# Patient Record
Sex: Female | Born: 1960 | Race: White | Hispanic: No | Smoking: Never smoker
Health system: Southern US, Community
[De-identification: ages and names within clinical notes are randomized; demographics above are authoritative.]

## PROBLEM LIST (undated history)

## (undated) DIAGNOSIS — F419 Anxiety disorder, unspecified: Secondary | ICD-10-CM

## (undated) DIAGNOSIS — D249 Benign neoplasm of unspecified breast: Secondary | ICD-10-CM

## (undated) DIAGNOSIS — F319 Bipolar disorder, unspecified: Secondary | ICD-10-CM

## (undated) HISTORY — PX: WISDOM TOOTH EXTRACTION: SHX21

## (undated) HISTORY — PX: ABDOMINAL HYSTERECTOMY: SHX81

---

## 1999-02-05 ENCOUNTER — Other Ambulatory Visit: Admission: RE | Admit: 1999-02-05 | Discharge: 1999-02-05 | Payer: Self-pay | Admitting: Obstetrics & Gynecology

## 1999-03-07 ENCOUNTER — Encounter: Payer: Self-pay | Admitting: Specialist

## 1999-03-07 ENCOUNTER — Ambulatory Visit (HOSPITAL_COMMUNITY): Admission: RE | Admit: 1999-03-07 | Discharge: 1999-03-07 | Payer: Self-pay | Admitting: Specialist

## 1999-04-21 ENCOUNTER — Encounter: Payer: Self-pay | Admitting: Specialist

## 1999-04-21 ENCOUNTER — Ambulatory Visit (HOSPITAL_COMMUNITY): Admission: RE | Admit: 1999-04-21 | Discharge: 1999-04-21 | Payer: Self-pay | Admitting: Specialist

## 2000-05-04 ENCOUNTER — Other Ambulatory Visit: Admission: RE | Admit: 2000-05-04 | Discharge: 2000-05-04 | Payer: Self-pay | Admitting: Obstetrics & Gynecology

## 2000-06-08 ENCOUNTER — Encounter: Admission: RE | Admit: 2000-06-08 | Discharge: 2000-09-04 | Payer: Self-pay | Admitting: Anesthesiology

## 2001-08-26 ENCOUNTER — Other Ambulatory Visit: Admission: RE | Admit: 2001-08-26 | Discharge: 2001-08-26 | Payer: Self-pay | Admitting: Obstetrics & Gynecology

## 2002-11-07 ENCOUNTER — Other Ambulatory Visit: Admission: RE | Admit: 2002-11-07 | Discharge: 2002-11-07 | Payer: Self-pay | Admitting: Obstetrics & Gynecology

## 2003-09-07 ENCOUNTER — Ambulatory Visit (HOSPITAL_COMMUNITY): Admission: RE | Admit: 2003-09-07 | Discharge: 2003-09-07 | Payer: Self-pay | Admitting: Obstetrics & Gynecology

## 2003-12-14 ENCOUNTER — Other Ambulatory Visit: Admission: RE | Admit: 2003-12-14 | Discharge: 2003-12-14 | Payer: Self-pay | Admitting: Obstetrics & Gynecology

## 2007-04-22 ENCOUNTER — Emergency Department (HOSPITAL_COMMUNITY): Admission: EM | Admit: 2007-04-22 | Discharge: 2007-04-23 | Payer: Self-pay | Admitting: Emergency Medicine

## 2009-06-25 ENCOUNTER — Encounter: Admission: RE | Admit: 2009-06-25 | Discharge: 2009-06-25 | Payer: Self-pay | Admitting: Obstetrics & Gynecology

## 2010-01-26 ENCOUNTER — Encounter: Payer: Self-pay | Admitting: Emergency Medicine

## 2010-03-15 ENCOUNTER — Inpatient Hospital Stay (HOSPITAL_COMMUNITY): Payer: 59

## 2010-03-15 ENCOUNTER — Inpatient Hospital Stay (HOSPITAL_COMMUNITY)
Admission: AD | Admit: 2010-03-15 | Discharge: 2010-03-15 | Disposition: A | Discharge status: Home / Self Care | Payer: 59 | Source: Ambulatory Visit | Attending: Obstetrics and Gynecology | Admitting: Obstetrics and Gynecology

## 2010-03-15 DIAGNOSIS — N949 Unspecified condition associated with female genital organs and menstrual cycle: Secondary | ICD-10-CM

## 2010-03-15 DIAGNOSIS — N938 Other specified abnormal uterine and vaginal bleeding: Secondary | ICD-10-CM

## 2010-03-15 DIAGNOSIS — D259 Leiomyoma of uterus, unspecified: Secondary | ICD-10-CM | POA: Insufficient documentation

## 2010-03-15 LAB — CBC
HCT: 36.3 % (ref 36.0–46.0)
Hemoglobin: 11.9 g/dL — ABNORMAL LOW (ref 12.0–15.0)
MCH: 29.8 pg (ref 26.0–34.0)
MCHC: 32.8 g/dL (ref 30.0–36.0)
MCV: 90.8 fL (ref 78.0–100.0)
Platelets: 304 10*3/uL (ref 150–400)
RBC: 4 MIL/uL (ref 3.87–5.11)
RDW: 13 % (ref 11.5–15.5)
WBC: 8.7 10*3/uL (ref 4.0–10.5)

## 2010-03-15 LAB — WET PREP, GENITAL
Clue Cells Wet Prep HPF POC: NONE SEEN
Trich, Wet Prep: NONE SEEN
Yeast Wet Prep HPF POC: NONE SEEN

## 2010-05-23 NOTE — Procedures (Signed)
[facility]  Patient:    Kelly Nelson, Kelly Nelson                        MRN: [id number] Proc. Date: 06/09/00 Adm. Date:  [id number] Attending:  Thyra Breed CC:         Javier Docker, M.D.   Procedure Report  PROCEDURE:  Bilateral facet joint injections at L5-S1 and L4-5.  DIAGNOSIS:  Lumbar spondylosis.  HISTORY OF PRESENT ILLNESS:  Kelly Nelson is a 50 year old who was sent to [place] by Dr. Jene Every for facet joint injections and consideration for pain management. The patient states that she was in her usual state of health up until about six years ago when she had the sudden onset of lower back discomfort. Prior to that, shed had some intermittent problems. She was initially treated conservatively with nonsteroidal anti-inflammatory agents then Vicodin which resulted in migraine headaches and eventually went through lumbar epidural steroid injections about five years ago and about April of 2001 began to get facet joint injections. She noted that these would help tremendously. Her first set was done by Dr. Barrington Ellison and her second set done by Dr. Ethelene Hal. She was doing remarkably well after her last set in August of 2001 when she fell down some steps in February of 2002 which stirred up her symptoms. Its the same symptoms that she has had previously. The discomfort did not occur immediately after she fell down the steps but began about two to three weeks later. She notes that the pain would affect her to the point that she had difficulty straightening up and she was placed on Percocet which she states sedates her and upsets her stomach and may take some of the pain down but is not a very good pain reliever. She can only take about 1-3 per day. Dr. Shelle Iron evaluate her and placed her on Percocet at this point and when she did not improve, placed her in integrative therapies which she has noted has been very helpful. She is starting into biofeedback with them. Prior to  having the injections, she had been through chiropractic treatments which had not been helpful and had a trial of a TENS then which was not helpful.  She describes her pain as a hot burning pain in the lumbosacral region which is exacerbated to a knife-like sensation whenever she moves. She notes that she has intermittent numbness and tingling of the arms and legs and global but not focal weakness. She denied bowel or bladder incontinence. It is made worse by prolonged movements or stationary positions and improved by moderate movement and activities. She is currently on Mobic which she states is not helpful at all, Robaxin which she rarely takes, trazodone which helps her to rest at night and Percocet which upsets her stomach more then helps her.  CURRENT MEDICATIONS:  Percocet, Mobic, robaxin, trazodone, orthocycline.  ALLERGIES:  No known drug allergies.  FAMILY HISTORY:  Positive for coronary artery disease, cancer, hypertension, hypothyroidism, osteoarthritis.  ACTIVE MEDICAL PROBLEMS:  Hypertension, osteoarthritis predominantly of the axial skeleton.  PAST SURGICAL HISTORY:  Significant for the injections but otherwise negative.  SOCIAL HISTORY:  The patients a nonsmoker, nondrinker. 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  REVIEW OF SYSTEMS:  GENERAL:  Negative. HEAD:  Significant for migraine headaches for which she sees Dr. Meryl Crutch who started her on the Trazodone. EYES:  Negative.  NOSE/MOUTH/THROAT:  Negative. EARS:  Significant for itching otherwise negative. LUNGS: Negative. CARDIOVASCULAR:  Significant for episodic elevations in blood pressure but not sustained hypertension. GI: Negative. GU:  Negative. MUSCULOSKELETAL:  See HPI. NEUROLOGIC:  No history of seizure or stroke. See HPI. HEMATOLOGIC:  History of anemia. ENDOCRINE: Negative. CUTANEOUS:  Negative. PSYCHIATRIC:  Positive for depression. ALLERGY/IMMUNOLOGIC:   Positive for hay fever.  Investigations forwarded with the patient included a cervical MRI from May 05, 2000 which showed degenerative disk disease with disk herniations at multiple levels, thoracic MRI which showed disk herniation at T6-7 and degenerative disk disease at multiple levels and a lumbar MRI which showed multilevel degenerative disk disease and facet joint arthropathy. She apparently has had an EMG performed by Dr. Ethelene Hal which was interpreted as normal of the upper extremities.  PHYSICAL EXAMINATION:  VITAL SIGNS:  Blood pressure 125/78, heart rate 89, respiratory rate 18, O2 saturations 98%, pain level is 8/10 and temperature is 97.7.  GENERAL:  This is a pleasant female in no acute distress.  HEENT:  Head was normocephalic, atraumatic. Eyes, extraocular movements intact with conjunctivae and sclerae clear. Nose patent nares. Oropharynx was free of lesions.  NECK:  Supple without lymphadenopathy. Carotids are 2+ and symmetric without bruits.  LUNGS:  Clear to auscultation and percussion.  HEART:  Regular rate and rhythm.  BREASTS/ABDOMINAL/PELVIC/RECTAL:  Not performed.  BACK:  Revealed increased pain on hyperextension to about 10-15 degrees with forward flexion reducing her discomfort. Straight leg raise signs were negative.  EXTREMITIES:  No cyanosis, clubbing nor edema with radial pulses and dorsalis pedis pulse 2+ and symmetric.  NEUROLOGIC:  The patient was oriented x 4. Cranial nerves II-XII are grossly intact. Deep tendon reflexes were symmetric in the upper and lower extremity with downgoing toes. Motor was 5/5 with symmetric bulk and tone. Coordination was grossly intact.  IMPRESSION: 1. Chronic low back pain syndrome responsive to facet joint injections with    underlying facet joint arthritis and degenerative disk disease in    combination for lumbar spondylosis. 2. Cervical degenerative disk disease. 3. Thoracic degenerative disk disease. 4.  Migraine headaches per Dr. Meryl Crutch.  5. Depression for which she is on Trazodone from Dr. Meryl Crutch. 6. Hay fever. 7. History of anemia.  DISPOSITION:  I discussed with the patient treatment options. She is not really taking enough opiates to merit going on long acting opiates and it did not sound like they are really significantly helpful. She has been helped by injection in the past so I recommend that we go ahead and proceed with facet joint injections today which she is quite open to. I advised her since the Mobic does not appear to be helping her she could probably just go off this.  DESCRIPTION OF PROCEDURE:  After informed consent was obtained, the patient was taken to the fluoroscopy suite where she was placed in the prone position with a pillow under her abdomen and monitored. Her back was prepped with Betaine x 3. I draped out the back with towels. Using fluoroscopic guidance, I optimized visualization of the L4-5 and L5-S1 facet joints on the right side first. The skin was marked and anesthetized with a 25 gauge needle using 2 cc of 1% lidocaine. Twenty-two gauge Chiba needles were introduced into the L4-5 and 5-S1 facet joints confirmed by lateral, oblique and AP projections. Aspiration was negative for blood and CSF. I injected a 0.5 cc of 1% lidocaine at each level with 20 mg of Medrol mixed with 1 cc  of 1% lidocaine. The needles were flushed with 1% lidocaine and removed intact. On the left side, the beam was optimized and skin marked and anesthetized using 25 gauge needles with 1% lidocaine using 2 cc at each site. Twenty-two gauge chiba needles were likewise placed and injected on the left side. The needles were removed intact.  Fifteen minutes later, the patient was noted to have marked reduction in her pain.  DISPOSITION: 1. Resume previous diet. 2. Limitations in activities per instruction sheet as outlined by my assistant    today. 3. Continue on current  medications with the exception of stopping the Mobic. 4. Followup with me in four weeks to assess how well she responds to the    injections. DD:  06/09/00 TD:  06/09/00 Job: [id number]

## 2010-05-23 NOTE — Consult Note (Signed)
[facility]  Patient:    Kelly Nelson, Kelly Nelson                        MRN: [id number] Proc. Date: 07/07/00 Adm. Date:  [id number] Attending:  Thyra Breed CC:         Javier Docker, M.D.   Consultation Report  FOLLOW-UP EVALUATION  Aiesha comes in for follow-up evaluation after her facet joint injections on June 09, 2000.  She has noted minimal improvement after these injections.  As I explained to her, I felt like she was getting to a point where they may not be very helpful, and it sounds like further injection therapy may not be of much benefit unless we were to go in and consider doing facet joint nerve blocks. She has been able to tolerate 1-2 tablets of Percocet per day but feels sedated by these.  She was started on celebrex last week by Dr. Shelle Iron and does feel as though this has resulted in a 20-30% reduction in her pain.  She is complaining of neck and lower back pain.  She cannot sit for long periods of time without a great deal of discomfort.  She has some persistent pain in the fourth and fifth fingers of both hands.  She describes her pain as an ongoing burning-type discomfort in the lumbosacral region with knife-like discomfort that is radiating peripherally.  She rates her pain at 7/10 today.  Current medications are trazodone 150-300 mg per day; Celebrex, and Percocet.  PHYSICAL EXAMINATION:  Blood pressure is 122/81.  Heart rate is 84, respiratory rates 18, O2 saturations 98%.  Pain level is 7/10.  Range of motion of her neck is relatively intact with negative with negative Spurling signs.  Deep tendon reflexes are symmetric in the upper extremities.  The lower extremity demonstrated symmetric deep tendon reflexes with negative straight leg raise signs.  IMPRESSION: 1. Chronic low back pain on the basis of lumbar facet joint arthritis    secondary to lumbar spondylosis. 2. Cervical degenerative disk disease. 3. Migraine headaches per  Dr. Meryl Crutch, apparently exacerbated by hydrocodone. 4. Other medical problems per primary care physician.  DISPOSITION: 1. Methadone 5 mg 1/2 tablet p.o. q.12h. #30.  She is aware of the potential    side effects of this medication in which I reviewed with her in great    detail.  I also advised her that we were starting with an incredibly low    dose to see how she responds to this. 2. Continue with Celebrex per Dr. Shelle Iron. 3. Continue on trazodone per Dr. Meryl Crutch. 4. Follow up with me in four weeks. 5. I wrote her a prescription for her work so that she can get up every hour    to walk around for five minutes and recommended they elevate her keyboard    as long as it does not exacerbate any potential carpal tunnel type    symptoms.  I encouraged her to speak with her physical therapist with    regard to any ergonomics recommendations and have these written down so    that I could write these as they needed to be from a physician.  She was    encouraged to continue with her physical therapy. DD:  07/07/00 TD:  07/07/00 Job: [id number]

## 2010-09-30 LAB — POCT CARDIAC MARKERS
CKMB, poc: 1.8
Operator id: 4533
Troponin i, poc: <0.05

## 2010-09-30 LAB — BASIC METABOLIC PANEL
BUN: 9
Calcium: 9.4
GFR calc Af Amer: >60
GFR calc non Af Amer: >60
Sodium: 143

## 2010-09-30 LAB — DIFFERENTIAL
Eosinophils Absolute: 0.6
Lymphocytes Relative: 35
Lymphs Abs: 3.9
Monocytes Absolute: 0.7
Monocytes Relative: 6
Neutrophils Relative %: 54

## 2010-09-30 LAB — CBC
Hemoglobin: 13.6
MCHC: 33.4
MCV: 91.4
RDW: 12.8

## 2016-01-08 DIAGNOSIS — F3181 Bipolar II disorder: Secondary | ICD-10-CM | POA: Diagnosis not present

## 2016-01-08 DIAGNOSIS — F419 Anxiety disorder, unspecified: Secondary | ICD-10-CM | POA: Diagnosis not present

## 2016-01-09 DIAGNOSIS — N951 Menopausal and female climacteric states: Secondary | ICD-10-CM | POA: Diagnosis not present

## 2016-01-09 DIAGNOSIS — R3915 Urgency of urination: Secondary | ICD-10-CM | POA: Diagnosis not present

## 2016-01-09 DIAGNOSIS — N3281 Overactive bladder: Secondary | ICD-10-CM | POA: Diagnosis not present

## 2016-04-08 ENCOUNTER — Other Ambulatory Visit: Payer: Self-pay | Admitting: Family Medicine

## 2016-04-08 DIAGNOSIS — Z1231 Encounter for screening mammogram for malignant neoplasm of breast: Secondary | ICD-10-CM

## 2016-04-27 ENCOUNTER — Ambulatory Visit
Admission: RE | Admit: 2016-04-27 | Discharge: 2016-04-27 | Disposition: A | Discharge status: Home / Self Care | Payer: BLUE CROSS/BLUE SHIELD | Source: Ambulatory Visit | Attending: Family Medicine | Admitting: Family Medicine

## 2016-04-27 DIAGNOSIS — Z1231 Encounter for screening mammogram for malignant neoplasm of breast: Secondary | ICD-10-CM

## 2016-04-29 ENCOUNTER — Other Ambulatory Visit: Payer: Self-pay | Admitting: Family Medicine

## 2016-04-29 DIAGNOSIS — R928 Other abnormal and inconclusive findings on diagnostic imaging of breast: Secondary | ICD-10-CM

## 2016-05-04 ENCOUNTER — Ambulatory Visit
Admission: RE | Admit: 2016-05-04 | Discharge: 2016-05-04 | Disposition: A | Discharge status: Home / Self Care | Payer: BLUE CROSS/BLUE SHIELD | Source: Ambulatory Visit | Attending: Family Medicine | Admitting: Family Medicine

## 2016-05-04 ENCOUNTER — Other Ambulatory Visit: Payer: Self-pay | Admitting: Family Medicine

## 2016-05-04 DIAGNOSIS — R928 Other abnormal and inconclusive findings on diagnostic imaging of breast: Secondary | ICD-10-CM

## 2016-05-04 DIAGNOSIS — N6489 Other specified disorders of breast: Secondary | ICD-10-CM | POA: Diagnosis not present

## 2016-05-21 DIAGNOSIS — Z86010 Personal history of colonic polyps: Secondary | ICD-10-CM | POA: Diagnosis not present

## 2016-05-21 DIAGNOSIS — Z1211 Encounter for screening for malignant neoplasm of colon: Secondary | ICD-10-CM | POA: Diagnosis not present

## 2016-07-07 DIAGNOSIS — R350 Frequency of micturition: Secondary | ICD-10-CM | POA: Diagnosis not present

## 2016-08-10 DIAGNOSIS — E559 Vitamin D deficiency, unspecified: Secondary | ICD-10-CM | POA: Diagnosis not present

## 2016-08-10 DIAGNOSIS — Z1329 Encounter for screening for other suspected endocrine disorder: Secondary | ICD-10-CM | POA: Diagnosis not present

## 2016-08-10 DIAGNOSIS — Z131 Encounter for screening for diabetes mellitus: Secondary | ICD-10-CM | POA: Diagnosis not present

## 2016-08-10 DIAGNOSIS — Z136 Encounter for screening for cardiovascular disorders: Secondary | ICD-10-CM | POA: Diagnosis not present

## 2016-08-10 DIAGNOSIS — Z0000 Encounter for general adult medical examination without abnormal findings: Secondary | ICD-10-CM | POA: Diagnosis not present

## 2016-10-05 ENCOUNTER — Other Ambulatory Visit: Payer: BLUE CROSS/BLUE SHIELD

## 2016-10-14 ENCOUNTER — Ambulatory Visit
Admission: RE | Admit: 2016-10-14 | Discharge: 2016-10-14 | Disposition: A | Discharge status: Home / Self Care | Payer: BLUE CROSS/BLUE SHIELD | Source: Ambulatory Visit | Attending: Family Medicine | Admitting: Family Medicine

## 2016-10-14 ENCOUNTER — Other Ambulatory Visit: Payer: Self-pay | Admitting: Family Medicine

## 2016-10-14 DIAGNOSIS — N6312 Unspecified lump in the right breast, upper inner quadrant: Secondary | ICD-10-CM | POA: Diagnosis not present

## 2016-10-14 DIAGNOSIS — N6310 Unspecified lump in the right breast, unspecified quadrant: Secondary | ICD-10-CM

## 2016-10-14 DIAGNOSIS — R928 Other abnormal and inconclusive findings on diagnostic imaging of breast: Secondary | ICD-10-CM

## 2016-10-16 DIAGNOSIS — F3181 Bipolar II disorder: Secondary | ICD-10-CM | POA: Diagnosis not present

## 2016-10-19 ENCOUNTER — Ambulatory Visit
Admission: RE | Admit: 2016-10-19 | Discharge: 2016-10-19 | Disposition: A | Discharge status: Home / Self Care | Payer: BLUE CROSS/BLUE SHIELD | Source: Ambulatory Visit | Attending: Family Medicine | Admitting: Family Medicine

## 2016-10-19 ENCOUNTER — Other Ambulatory Visit: Payer: Self-pay | Admitting: Family Medicine

## 2016-10-19 DIAGNOSIS — N6341 Unspecified lump in right breast, subareolar: Secondary | ICD-10-CM | POA: Diagnosis not present

## 2016-10-19 DIAGNOSIS — N6041 Mammary duct ectasia of right breast: Secondary | ICD-10-CM | POA: Diagnosis not present

## 2016-10-19 DIAGNOSIS — N6310 Unspecified lump in the right breast, unspecified quadrant: Secondary | ICD-10-CM

## 2016-10-20 ENCOUNTER — Other Ambulatory Visit: Payer: Self-pay | Admitting: Family Medicine

## 2016-10-20 DIAGNOSIS — N6310 Unspecified lump in the right breast, unspecified quadrant: Secondary | ICD-10-CM

## 2016-10-20 DIAGNOSIS — R102 Pelvic and perineal pain: Secondary | ICD-10-CM | POA: Diagnosis not present

## 2016-10-21 ENCOUNTER — Other Ambulatory Visit: Payer: Self-pay | Admitting: Family Medicine

## 2016-10-21 ENCOUNTER — Ambulatory Visit
Admission: RE | Admit: 2016-10-21 | Discharge: 2016-10-21 | Disposition: A | Discharge status: Home / Self Care | Payer: BLUE CROSS/BLUE SHIELD | Source: Ambulatory Visit | Attending: Family Medicine | Admitting: Family Medicine

## 2016-10-21 DIAGNOSIS — N6312 Unspecified lump in the right breast, upper inner quadrant: Secondary | ICD-10-CM | POA: Diagnosis not present

## 2016-10-21 DIAGNOSIS — N6310 Unspecified lump in the right breast, unspecified quadrant: Secondary | ICD-10-CM

## 2016-11-02 ENCOUNTER — Other Ambulatory Visit: Payer: Self-pay | Admitting: Family Medicine

## 2016-11-02 ENCOUNTER — Ambulatory Visit
Admission: RE | Admit: 2016-11-02 | Discharge: 2016-11-02 | Disposition: A | Discharge status: Home / Self Care | Payer: BLUE CROSS/BLUE SHIELD | Source: Ambulatory Visit | Attending: Family Medicine | Admitting: Family Medicine

## 2016-11-02 DIAGNOSIS — N6310 Unspecified lump in the right breast, unspecified quadrant: Secondary | ICD-10-CM

## 2016-11-02 DIAGNOSIS — N6312 Unspecified lump in the right breast, upper inner quadrant: Secondary | ICD-10-CM | POA: Diagnosis not present

## 2016-11-12 DIAGNOSIS — Z23 Encounter for immunization: Secondary | ICD-10-CM | POA: Diagnosis not present

## 2017-03-05 ENCOUNTER — Other Ambulatory Visit: Payer: Self-pay | Admitting: Surgery

## 2017-03-05 ENCOUNTER — Ambulatory Visit: Payer: Self-pay | Admitting: Surgery

## 2017-03-05 DIAGNOSIS — D241 Benign neoplasm of right breast: Secondary | ICD-10-CM

## 2017-03-05 NOTE — H&P (View-Only) (Signed)
Kelly Nelson Documented: 03/05/2017 11:32 AM Location: [facility] Patient #: [id number] DOB: [birth year] Single / Language: Kelly Nelson / Race: White Female  History of Present Illness Kelly Nelson A. Kelly Kelm MD; 03/05/2017 11:52 AM) Patient words: The patient was called back from screening mammography on April 27, 2016 for a right breast mass. The diagnostic mammogram and ultrasound May 04, 2016 demonstrated two sonographically identified masses. Both were followed in October 2018. On October 19, 2016, the 1 o'clock subareolar mass was biopsied with instructions to biopsy the second mass at 1 o'clock, 1 cm from the nipple if surgery was required due to the biopsied mass. The patient returned 2 weeks ago but the mass at 1 o'clock, 1 cm from the nipple was not visualized due to a post biopsy hematoma. She returns today.  EXAM: ULTRASOUND OF THE RIGHT BREAST  COMPARISON: Previous exam(s).  FINDINGS: On physical exam, no suspicious lumps are identified. The hematoma has nearly resolved clinically.  Targeted ultrasound is performed, showing the biopsied mass at 1 o'clock in the subareolar region. The previously identified isoechoic mass, described as a fibroadenoma versus a fat lobule at 1 o'clock, 1 cm from the nipple was not visualized today  IMPRESSION: The mass at 1 o'clock, 1 cm from the nipple cannot be visualized. There are 3 possibilities for nonvisualization. First, the mass may have represented a fat lobule which is simply no longer as conspicuous due to changes from recent adjacent biopsy. This is considered most likely. Second, the mass could have represented a complicated cyst which was disrupted at the time of biopsy. This option is considered unlikely. Finally, the mass could have represented an isoechoic fibroadenoma although this is also considered less likely.  RECOMMENDATION: If clinically warranted, an MRI could further evaluate the breast to exclude an  underlying suspicious mass. However, I suspect the mass was most likely a fat lobule which is no longer seen due to post biopsy changes rather than a true mass.  I have discussed the findings and recommendations with the patient. Results were also provided in writing at the conclusion of the visit. If applicable, a reminder letter will be sent to the patient regarding the next appointment.  BI-RADS CATEGORY 2: Benign.   Electronically Signed By: Kelly Nelson M.D            Diagnosis Breast, right, needle core biopsy, 1:00 o'clock retroareolar - FEATURES CONSISTENT WITH A PAPILLARY LESION - SEE COMMENT Microscopic Comment The biopsy has cystically dilated glands with small foci suggestive of fibrovascular cores. There are no atypical features noted in the ductal epithelium but the biopsy material is limited. These results were called to [facility] on October 20, 2016. Kelly Sheller MD Pathologist, Electronic Signature (Case signed 10/20/2016).  The patient is a 57 year old female.   Past Surgical History (Kelly Nelson, [place]; 03/05/2017 11:32 AM) Breast Biopsy Right. Colon Polyp Removal - Colonoscopy Hysterectomy (not due to cancer) - Complete Oral Surgery  Diagnostic Studies History (Kelly Nelson, [place]; 03/05/2017 11:32 AM) Colonoscopy within last year Mammogram within last year Pap Smear >5 years ago  Allergies (Kelly Nelson, [place]; 03/05/2017 11:34 AM) Sulfa Antibiotics Nausea, Vomiting. Allergies Reconciled  Medication History (Kelly Nelson, RMA; 03/05/2017 11:35 AM) TraZODone HCl (100MG  Tablet, Oral) Active. Estradiol (2MG  Tablet, Oral) Active. ALPRAZolam (1MG  Tablet, Oral) Active. OXcarbazepine (150MG  Tablet, Oral) Active. Ziprasidone HCl (20MG  Capsule, Oral) Active. Medications Reconciled  Social History (Kelly Nelson, [place]; 03/05/2017 11:32 AM)  Alcohol use Moderate alcohol use. Caffeine use  Coffee, Tea. Illicit drug use Uses socially only. Tobacco use Never smoker.  Family History (Kelly Nelson, [place]; 03/05/2017 11:32 AM) Arthritis Father, Mother. Bleeding disorder Daughter. Depression Daughter. Heart Disease Father, Mother. Heart disease in female family member before age 51 Hypertension Father. Migraine Headache Daughter. Ovarian Cancer Mother. Seizure disorder Daughter. Thyroid problems Brother, Mother.  Pregnancy / Birth History (Kelly Nelson, [place]; 03/05/2017 11:32 AM) Age at menarche 38 years. Age of menopause 5-55 Gravida 1 Irregular periods Length (months) of breastfeeding 3-6 Maternal age 64-35 Para 1  Other Problems (Kelly Nelson, [place]; 03/05/2017 11:32 AM) Anxiety Disorder Arthritis Asthma Back Pain Bladder Problems Depression High blood pressure Lump In Breast Migraine Headache     Review of Systems (Kelly A. Brown RMA; 03/05/2017 11:32 AM) General Present- Fatigue and Weight Gain. Not Present- Appetite Loss, Chills, Fever, Night Sweats and Weight Loss. Skin Present- Dryness. Not Present- Change in Wart/Mole, Hives, Jaundice, New Lesions, Non-Healing Wounds, Rash and Ulcer. HEENT Not Present- Earache, Hearing Loss, Hoarseness, Nose Bleed, Oral Ulcers, Ringing in the Ears, Seasonal Allergies, Sinus Pain, Sore Throat, Visual Disturbances, Wears glasses/contact lenses and Yellow Eyes. Respiratory Present- Snoring. Not Present- Bloody sputum, Chronic Cough, Difficulty Breathing and Wheezing. Breast Present- Breast Mass. Not Present- Breast Pain, Nipple Discharge and Skin Changes. Cardiovascular Not Present- Chest Pain, Difficulty Breathing Lying Down, Leg Cramps, Palpitations, Rapid Heart Rate, Shortness of Breath and Swelling of Extremities. Gastrointestinal Not Present- Abdominal Pain, Bloating, Bloody Stool, Change in Bowel Habits, Chronic diarrhea, Constipation, Difficulty Swallowing, Excessive gas, Gets full  quickly at meals, Hemorrhoids, Indigestion, Nausea, Rectal Pain and Vomiting. Female Genitourinary Not Present- Frequency, Nocturia, Painful Urination, Pelvic Pain and Urgency. Musculoskeletal Present- Back Pain and Joint Stiffness. Not Present- Joint Pain, Muscle Pain, Muscle Weakness and Swelling of Extremities. Neurological Present- Headaches. Not Present- Decreased Memory, Fainting, Numbness, Seizures, Tingling, Tremor, Trouble walking and Weakness. Psychiatric Present- Anxiety, Bipolar and Depression. Not Present- Change in Sleep Pattern, Fearful and Frequent crying. Endocrine Not Present- Cold Intolerance, Excessive Hunger, Hair Changes, Heat Intolerance, Hot flashes and New Diabetes. Hematology Not Present- Blood Thinners, Easy Bruising, Excessive bleeding, Gland problems, HIV and Persistent Infections.  Vitals (Kelly A. Brown RMA; 03/05/2017 11:33 AM) 03/05/2017 11:32 AM Weight: 146.8 lb Height: 62in Body Surface Area: 1.68 m Body Mass Index: 26.85 kg/m  Temp.: 98.73F  Pulse: 82 (Regular)  BP: 128/86 (Sitting, Left Arm, Standard)      Physical Exam ([place] Shannahan A. Domingo Fuson MD; 03/05/2017 11:55 AM)  General Mental Status-Alert. General Appearance-Consistent with stated age. Hydration-Well hydrated. Voice-Normal.  Head and Neck Head-normocephalic, atraumatic with no lesions or palpable masses. Trachea-midline. Thyroid Gland Characteristics - normal size and consistency.  Breast Breast - Left-Symmetric, Non Tender, No Biopsy scars, no Dimpling, No Inflammation, No Lumpectomy scars, No Mastectomy scars, No Peau d' Orange. Breast - Right-Symmetric, Non Tender, No Biopsy scars, no Dimpling, No Inflammation, No Lumpectomy scars, No Mastectomy scars, No Peau d' Orange. Breast Lump-No Palpable Breast Mass.  Cardiovascular Cardiovascular examination reveals -normal heart sounds, regular rate and rhythm with no murmurs and normal pedal pulses  bilaterally.  Neurologic Neurologic evaluation reveals -alert and oriented x 3 with no impairment of recent or remote memory. Mental Status-Normal.  Musculoskeletal Normal Exam - Left-Upper Extremity Strength Normal and Lower Extremity Strength Normal. Normal Exam - Right-Upper Extremity Strength Normal and Lower Extremity Strength Normal.  Lymphatic Head & Neck  General Head & Neck Lymphatics: Bilateral - Description -  Normal. Axillary  General Axillary Region: Bilateral - Description - Normal. Tenderness - Non Tender.    Assessment & Plan ([place] Nida A. Mi Balla MD; 03/05/2017 11:54 AM)  PAPILLOMA OF RIGHT BREAST (D24.1) Impression: right breast seed lumpectomy Risk of lumpectomy include bleeding, infection, seroma, more surgery, use of seed/wire, wound care, cosmetic deformity and the need for other treatments, death , blood clots, death. Pt agrees to proceed.  Risk of malignancy is 3 - 4 %  Observation also discussed she desires lumpectomy  Current Plans You are being scheduled for surgery- Our schedulers will call you.  You should hear from our office's scheduling department within 5 working days about the location, date, and time of surgery. We try to make accommodations for patient's preferences in scheduling surgery, but sometimes the OR schedule or the surgeon's schedule prevents [place] from making those accommodations.  If you have not heard from our office [phone]) in 5 working days, call the office and ask for your surgeon's nurse.  If you have other questions about your diagnosis, plan, or surgery, call the office and ask for your surgeon's nurse.  Pt Education - [facility] Breast Biopsy HCI: discussed with patient and provided information.

## 2017-03-05 NOTE — H&P (Signed)
Kelly Nelson Documented: 03/05/2017 11:32 AM Location: [facility] Patient #: [id number] DOB: [birth year] Single / Language: Kelly Nelson / Race: White Female  History of Present Illness Kelly Moores A. Negan Grudzien MD; 03/05/2017 11:52 AM) Patient words: The patient was called back from screening mammography on April 27, 2016 for a right breast mass. The diagnostic mammogram and ultrasound May 04, 2016 demonstrated two sonographically identified masses. Both were followed in October 2018. On October 19, 2016, the 1 o'clock subareolar mass was biopsied with instructions to biopsy the second mass at 1 o'clock, 1 cm from the nipple if surgery was required due to the biopsied mass. The patient returned 2 weeks ago but the mass at 1 o'clock, 1 cm from the nipple was not visualized due to a post biopsy hematoma. She returns today.  EXAM: ULTRASOUND OF THE RIGHT BREAST  COMPARISON: Previous exam(s).  FINDINGS: On physical exam, no suspicious lumps are identified. The hematoma has nearly resolved clinically.  Targeted ultrasound is performed, showing the biopsied mass at 1 o'clock in the subareolar region. The previously identified isoechoic mass, described as a fibroadenoma versus a fat lobule at 1 o'clock, 1 cm from the nipple was not visualized today  IMPRESSION: The mass at 1 o'clock, 1 cm from the nipple cannot be visualized. There are 3 possibilities for nonvisualization. First, the mass may have represented a fat lobule which is simply no longer as conspicuous due to changes from recent adjacent biopsy. This is considered most likely. Second, the mass could have represented a complicated cyst which was disrupted at the time of biopsy. This option is considered unlikely. Finally, the mass could have represented an isoechoic fibroadenoma although this is also considered less likely.  RECOMMENDATION: If clinically warranted, an MRI could further evaluate the breast to exclude an  underlying suspicious mass. However, I suspect the mass was most likely a fat lobule which is no longer seen due to post biopsy changes rather than a true mass.  I have discussed the findings and recommendations with the patient. Results were also provided in writing at the conclusion of the visit. If applicable, a reminder letter will be sent to the patient regarding the next appointment.  BI-RADS CATEGORY 2: Benign.   Electronically Signed By: Kelly Nelson M.D            Diagnosis Breast, right, needle core biopsy, 1:00 o'clock retroareolar - FEATURES CONSISTENT WITH A PAPILLARY LESION - SEE COMMENT Microscopic Comment The biopsy has cystically dilated glands with small foci suggestive of fibrovascular cores. There are no atypical features noted in the ductal epithelium but the biopsy material is limited. These results were called to [facility] on October 20, 2016. Kelly Sheller MD Pathologist, Electronic Signature (Case signed 10/20/2016).  The patient is a 57 year old female.   Past Surgical History (Tanisha A. Owens Shark, [place]; 03/05/2017 11:32 AM) Breast Biopsy Right. Colon Polyp Removal - Colonoscopy Hysterectomy (not due to cancer) - Complete Oral Surgery  Diagnostic Studies History (Tanisha A. Owens Shark, [place]; 03/05/2017 11:32 AM) Colonoscopy within last year Mammogram within last year Pap Smear >5 years ago  Allergies (Tanisha A. Owens Shark, [place]; 03/05/2017 11:34 AM) Sulfa Antibiotics Nausea, Vomiting. Allergies Reconciled  Medication History (Tanisha A. Owens Shark, RMA; 03/05/2017 11:35 AM) TraZODone HCl (100MG  Tablet, Oral) Active. Estradiol (2MG  Tablet, Oral) Active. ALPRAZolam (1MG  Tablet, Oral) Active. OXcarbazepine (150MG  Tablet, Oral) Active. Ziprasidone HCl (20MG  Capsule, Oral) Active. Medications Reconciled  Social History (Tanisha A. Owens Shark, [place]; 03/05/2017 11:32 AM)  Alcohol use Moderate alcohol use. Caffeine use  Coffee, Tea. Illicit drug use Uses socially only. Tobacco use Never smoker.  Family History (Tanisha A. Owens Shark, [place]; 03/05/2017 11:32 AM) Arthritis Father, Mother. Bleeding disorder Daughter. Depression Daughter. Heart Disease Father, Mother. Heart disease in female family member before age 24 Hypertension Father. Migraine Headache Daughter. Ovarian Cancer Mother. Seizure disorder Daughter. Thyroid problems Brother, Mother.  Pregnancy / Birth History (Tanisha A. Owens Shark, [place]; 03/05/2017 11:32 AM) Age at menarche 53 years. Age of menopause 63-55 Gravida 1 Irregular periods Length (months) of breastfeeding 3-6 Maternal age 22-35 Para 1  Other Problems (Tanisha A. Owens Shark, [place]; 03/05/2017 11:32 AM) Anxiety Disorder Arthritis Asthma Back Pain Bladder Problems Depression High blood pressure Lump In Breast Migraine Headache     Review of Systems (Tanisha A. Brown RMA; 03/05/2017 11:32 AM) General Present- Fatigue and Weight Gain. Not Present- Appetite Loss, Chills, Fever, Night Sweats and Weight Loss. Skin Present- Dryness. Not Present- Change in Wart/Mole, Hives, Jaundice, New Lesions, Non-Healing Wounds, Rash and Ulcer. HEENT Not Present- Earache, Hearing Loss, Hoarseness, Nose Bleed, Oral Ulcers, Ringing in the Ears, Seasonal Allergies, Sinus Pain, Sore Throat, Visual Disturbances, Wears glasses/contact lenses and Yellow Eyes. Respiratory Present- Snoring. Not Present- Bloody sputum, Chronic Cough, Difficulty Breathing and Wheezing. Breast Present- Breast Mass. Not Present- Breast Pain, Nipple Discharge and Skin Changes. Cardiovascular Not Present- Chest Pain, Difficulty Breathing Lying Down, Leg Cramps, Palpitations, Rapid Heart Rate, Shortness of Breath and Swelling of Extremities. Gastrointestinal Not Present- Abdominal Pain, Bloating, Bloody Stool, Change in Bowel Habits, Chronic diarrhea, Constipation, Difficulty Swallowing, Excessive gas, Gets full  quickly at meals, Hemorrhoids, Indigestion, Nausea, Rectal Pain and Vomiting. Female Genitourinary Not Present- Frequency, Nocturia, Painful Urination, Pelvic Pain and Urgency. Musculoskeletal Present- Back Pain and Joint Stiffness. Not Present- Joint Pain, Muscle Pain, Muscle Weakness and Swelling of Extremities. Neurological Present- Headaches. Not Present- Decreased Memory, Fainting, Numbness, Seizures, Tingling, Tremor, Trouble walking and Weakness. Psychiatric Present- Anxiety, Bipolar and Depression. Not Present- Change in Sleep Pattern, Fearful and Frequent crying. Endocrine Not Present- Cold Intolerance, Excessive Hunger, Hair Changes, Heat Intolerance, Hot flashes and New Diabetes. Hematology Not Present- Blood Thinners, Easy Bruising, Excessive bleeding, Gland problems, HIV and Persistent Infections.  Vitals (Tanisha A. Brown RMA; 03/05/2017 11:33 AM) 03/05/2017 11:32 AM Weight: 146.8 lb Height: 62in Body Surface Area: 1.68 m Body Mass Index: 26.85 kg/m  Temp.: 98.18F  Pulse: 82 (Regular)  BP: 128/86 (Sitting, Left Arm, Standard)      Physical Exam (Aven Cegielski A. Cai Anfinson MD; 03/05/2017 11:55 AM)  General Mental Status-Alert. General Appearance-Consistent with stated age. Hydration-Well hydrated. Voice-Normal.  Head and Neck Head-normocephalic, atraumatic with no lesions or palpable masses. Trachea-midline. Thyroid Gland Characteristics - normal size and consistency.  Breast Breast - Left-Symmetric, Non Tender, No Biopsy scars, no Dimpling, No Inflammation, No Lumpectomy scars, No Mastectomy scars, No Peau d' Orange. Breast - Right-Symmetric, Non Tender, No Biopsy scars, no Dimpling, No Inflammation, No Lumpectomy scars, No Mastectomy scars, No Peau d' Orange. Breast Lump-No Palpable Breast Mass.  Cardiovascular Cardiovascular examination reveals -normal heart sounds, regular rate and rhythm with no murmurs and normal pedal pulses  bilaterally.  Neurologic Neurologic evaluation reveals -alert and oriented x 3 with no impairment of recent or remote memory. Mental Status-Normal.  Musculoskeletal Normal Exam - Left-Upper Extremity Strength Normal and Lower Extremity Strength Normal. Normal Exam - Right-Upper Extremity Strength Normal and Lower Extremity Strength Normal.  Lymphatic Head & Neck  General Head & Neck Lymphatics: Bilateral - Description -  Normal. Axillary  General Axillary Region: Bilateral - Description - Normal. Tenderness - Non Tender.    Assessment & Plan (Jeremiah Tarpley A. Annalicia Renfrew MD; 03/05/2017 11:54 AM)  PAPILLOMA OF RIGHT BREAST (D24.1) Impression: right breast seed lumpectomy Risk of lumpectomy include bleeding, infection, seroma, more surgery, use of seed/wire, wound care, cosmetic deformity and the need for other treatments, death , blood clots, death. Pt agrees to proceed.  Risk of malignancy is 3 - 4 %  Observation also discussed she desires lumpectomy  Current Plans You are being scheduled for surgery- Our schedulers will call you.  You should hear from our office's scheduling department within 5 working days about the location, date, and time of surgery. We try to make accommodations for patient's preferences in scheduling surgery, but sometimes the OR schedule or the surgeon's schedule prevents [place] from making those accommodations.  If you have not heard from our office [phone]) in 5 working days, call the office and ask for your surgeon's nurse.  If you have other questions about your diagnosis, plan, or surgery, call the office and ask for your surgeon's nurse.  Pt Education - [facility] Breast Biopsy HCI: discussed with patient and provided information.

## 2017-03-08 DIAGNOSIS — F3181 Bipolar II disorder: Secondary | ICD-10-CM | POA: Diagnosis not present

## 2017-03-08 DIAGNOSIS — N959 Unspecified menopausal and perimenopausal disorder: Secondary | ICD-10-CM | POA: Diagnosis not present

## 2017-03-10 ENCOUNTER — Encounter (HOSPITAL_BASED_OUTPATIENT_CLINIC_OR_DEPARTMENT_OTHER): Payer: Self-pay | Admitting: *Deleted

## 2017-03-10 ENCOUNTER — Other Ambulatory Visit: Payer: Self-pay

## 2017-03-12 ENCOUNTER — Ambulatory Visit
Admission: RE | Admit: 2017-03-12 | Discharge: 2017-03-12 | Disposition: A | Discharge status: Home / Self Care | Payer: BLUE CROSS/BLUE SHIELD | Source: Ambulatory Visit | Attending: Surgery | Admitting: Surgery

## 2017-03-12 DIAGNOSIS — D241 Benign neoplasm of right breast: Secondary | ICD-10-CM

## 2017-03-12 NOTE — Progress Notes (Addendum)
Ensure pre surgery drink given with instructions to drink at [facility], pt verbalized understanding.

## 2017-03-15 NOTE — Anesthesia Preprocedure Evaluation (Signed)
Anesthesia Evaluation  Patient identified by MRN, date of birth, ID band Patient awake    Reviewed: Allergy & Precautions, H&P , Patient's Chart, lab work & pertinent test results, reviewed documented beta blocker date and time   Airway Mallampati: II  TM Distance: >3 FB Neck ROM: full    Dental no notable dental hx.    Pulmonary    Pulmonary exam normal breath sounds clear to auscultation       Cardiovascular  Rhythm:regular Rate:Normal     Neuro/Psych    GI/Hepatic   Endo/Other    Renal/GU      Musculoskeletal   Abdominal   Peds  Hematology   Anesthesia Other Findings   Reproductive/Obstetrics                             Anesthesia Physical Anesthesia Plan  ASA: II  Anesthesia Plan: General   Post-op Pain Management:    Induction: Intravenous  PONV Risk Score and Plan:   Airway Management Planned: LMA  Additional Equipment:   Intra-op Plan:   Post-operative Plan:   Informed Consent: I have reviewed the patients History and Physical, chart, labs and discussed the procedure including the risks, benefits and alternatives for the proposed anesthesia with the patient or authorized representative who has indicated his/her understanding and acceptance.   Dental Advisory Given  Plan Discussed with: CRNA and Surgeon  Anesthesia Plan Comments: ( )        Anesthesia Quick Evaluation

## 2017-03-16 ENCOUNTER — Ambulatory Visit (HOSPITAL_BASED_OUTPATIENT_CLINIC_OR_DEPARTMENT_OTHER): Payer: BLUE CROSS/BLUE SHIELD | Admitting: Anesthesiology

## 2017-03-16 ENCOUNTER — Other Ambulatory Visit: Payer: Self-pay

## 2017-03-16 ENCOUNTER — Encounter (HOSPITAL_BASED_OUTPATIENT_CLINIC_OR_DEPARTMENT_OTHER)
Admission: RE | Disposition: A | Discharge status: Home / Self Care | Payer: Self-pay | Source: Ambulatory Visit | Attending: Surgery

## 2017-03-16 ENCOUNTER — Encounter (HOSPITAL_BASED_OUTPATIENT_CLINIC_OR_DEPARTMENT_OTHER): Payer: Self-pay | Admitting: Anesthesiology

## 2017-03-16 ENCOUNTER — Ambulatory Visit (HOSPITAL_BASED_OUTPATIENT_CLINIC_OR_DEPARTMENT_OTHER)
Admission: RE | Admit: 2017-03-16 | Discharge: 2017-03-16 | Disposition: A | Discharge status: Home / Self Care | Payer: BLUE CROSS/BLUE SHIELD | Source: Ambulatory Visit | Attending: Surgery | Admitting: Surgery

## 2017-03-16 ENCOUNTER — Ambulatory Visit
Admission: RE | Admit: 2017-03-16 | Discharge: 2017-03-16 | Disposition: A | Discharge status: Home / Self Care | Payer: BLUE CROSS/BLUE SHIELD | Source: Ambulatory Visit | Attending: Surgery | Admitting: Surgery

## 2017-03-16 DIAGNOSIS — D241 Benign neoplasm of right breast: Secondary | ICD-10-CM | POA: Insufficient documentation

## 2017-03-16 DIAGNOSIS — Z79890 Hormone replacement therapy (postmenopausal): Secondary | ICD-10-CM | POA: Insufficient documentation

## 2017-03-16 DIAGNOSIS — Z8249 Family history of ischemic heart disease and other diseases of the circulatory system: Secondary | ICD-10-CM | POA: Insufficient documentation

## 2017-03-16 DIAGNOSIS — Z79899 Other long term (current) drug therapy: Secondary | ICD-10-CM | POA: Insufficient documentation

## 2017-03-16 DIAGNOSIS — N6011 Diffuse cystic mastopathy of right breast: Secondary | ICD-10-CM | POA: Diagnosis not present

## 2017-03-16 DIAGNOSIS — F319 Bipolar disorder, unspecified: Secondary | ICD-10-CM | POA: Diagnosis not present

## 2017-03-16 DIAGNOSIS — F419 Anxiety disorder, unspecified: Secondary | ICD-10-CM | POA: Insufficient documentation

## 2017-03-16 DIAGNOSIS — I10 Essential (primary) hypertension: Secondary | ICD-10-CM | POA: Insufficient documentation

## 2017-03-16 DIAGNOSIS — Z8041 Family history of malignant neoplasm of ovary: Secondary | ICD-10-CM | POA: Insufficient documentation

## 2017-03-16 DIAGNOSIS — M1990 Unspecified osteoarthritis, unspecified site: Secondary | ICD-10-CM | POA: Insufficient documentation

## 2017-03-16 DIAGNOSIS — R928 Other abnormal and inconclusive findings on diagnostic imaging of breast: Secondary | ICD-10-CM | POA: Diagnosis not present

## 2017-03-16 HISTORY — DX: Bipolar disorder, unspecified: F31.9

## 2017-03-16 HISTORY — DX: Anxiety disorder, unspecified: F41.9

## 2017-03-16 HISTORY — PX: BREAST LUMPECTOMY WITH RADIOACTIVE SEED LOCALIZATION: SHX6424

## 2017-03-16 HISTORY — DX: Benign neoplasm of unspecified breast: D24.9

## 2017-03-16 SURGERY — BREAST LUMPECTOMY WITH RADIOACTIVE SEED LOCALIZATION
Anesthesia: General | Site: Breast | Laterality: Right

## 2017-03-16 MED ORDER — CHLORHEXIDINE GLUCONATE CLOTH 2 % EX PADS: 6.0000 | MEDICATED_PAD | Freq: Once | CUTANEOUS | Status: DC

## 2017-03-16 MED ORDER — GLYCOPYRROLATE 0.2 MG/ML IJ SOLN
INTRAMUSCULAR | Status: DC | PRN
  Administered 2017-03-16: 0.2 mg via INTRAVENOUS

## 2017-03-16 MED ORDER — BUPIVACAINE-EPINEPHRINE (PF) 0.25% -1:200000 IJ SOLN
INTRAMUSCULAR | Status: DC | PRN
  Administered 2017-03-16: 20 mL

## 2017-03-16 MED ORDER — DEXAMETHASONE SODIUM PHOSPHATE 4 MG/ML IJ SOLN
INTRAMUSCULAR | Status: DC | PRN
  Administered 2017-03-16: 10 mg via INTRAVENOUS

## 2017-03-16 MED ORDER — SCOPOLAMINE 1 MG/3DAYS TD PT72: 1.0000 | MEDICATED_PATCH | Freq: Once | TRANSDERMAL | Status: DC | PRN

## 2017-03-16 MED ORDER — BUPIVACAINE-EPINEPHRINE 0.25% -1:200000 IJ SOLN
INTRAMUSCULAR | Status: AC
  Filled 2017-03-16: qty 1

## 2017-03-16 MED ORDER — FENTANYL CITRATE (PF) 100 MCG/2ML IJ SOLN: 50.0000 ug | INTRAMUSCULAR | Status: DC | PRN

## 2017-03-16 MED ORDER — HYDROCODONE-ACETAMINOPHEN 5-325 MG PO TABS: 1.0000 | ORAL_TABLET | Freq: Four times a day (QID) | ORAL | 0 refills | Status: AC | PRN

## 2017-03-16 MED ORDER — LACTATED RINGERS IV SOLN: INTRAVENOUS | Status: DC

## 2017-03-16 MED ORDER — FENTANYL CITRATE (PF) 100 MCG/2ML IJ SOLN
INTRAMUSCULAR | Status: AC
  Filled 2017-03-16: qty 4

## 2017-03-16 MED ORDER — CEFAZOLIN SODIUM-DEXTROSE 2-4 GM/100ML-% IV SOLN
INTRAVENOUS | Status: AC
Start: 2017-03-16 — End: 2017-03-16
  Filled 2017-03-16: qty 100

## 2017-03-16 MED ORDER — PROPOFOL 10 MG/ML IV BOLUS
INTRAVENOUS | Status: AC
  Filled 2017-03-16: qty 40

## 2017-03-16 MED ORDER — LIDOCAINE HCL (CARDIAC) 20 MG/ML IV SOLN
INTRAVENOUS | Status: DC | PRN
  Administered 2017-03-16: 100 mg via INTRAVENOUS

## 2017-03-16 MED ORDER — FENTANYL CITRATE (PF) 100 MCG/2ML IJ SOLN
INTRAMUSCULAR | Status: DC | PRN
  Administered 2017-03-16 (×2): 50 ug via INTRAVENOUS

## 2017-03-16 MED ORDER — ONDANSETRON HCL 4 MG/2ML IJ SOLN
INTRAMUSCULAR | Status: DC | PRN
  Administered 2017-03-16: 4 mg via INTRAVENOUS

## 2017-03-16 MED ORDER — MIDAZOLAM HCL 2 MG/2ML IJ SOLN: 1.0000 mg | INTRAMUSCULAR | Status: DC | PRN

## 2017-03-16 MED ORDER — LIDOCAINE HCL (CARDIAC) 20 MG/ML IV SOLN
INTRAVENOUS | Status: AC
  Filled 2017-03-16: qty 5

## 2017-03-16 MED ORDER — ACETAMINOPHEN 500 MG PO TABS
ORAL_TABLET | ORAL | Status: AC
  Filled 2017-03-16: qty 2

## 2017-03-16 MED ORDER — CELECOXIB 200 MG PO CAPS
ORAL_CAPSULE | ORAL | Status: AC
  Filled 2017-03-16: qty 1

## 2017-03-16 MED ORDER — MIDAZOLAM HCL 2 MG/2ML IJ SOLN
INTRAMUSCULAR | Status: DC | PRN
  Administered 2017-03-16: 2 mg via INTRAVENOUS

## 2017-03-16 MED ORDER — CEFAZOLIN SODIUM-DEXTROSE 2-4 GM/100ML-% IV SOLN
2.0000 g | INTRAVENOUS | Status: AC
  Administered 2017-03-16: 2 g via INTRAVENOUS

## 2017-03-16 MED ORDER — LACTATED RINGERS IV SOLN
INTRAVENOUS | Status: DC | PRN
  Administered 2017-03-16: 07:00:00 via INTRAVENOUS

## 2017-03-16 MED ORDER — BUPIVACAINE HCL (PF) 0.25 % IJ SOLN
INTRAMUSCULAR | Status: AC
  Filled 2017-03-16: qty 60

## 2017-03-16 MED ORDER — FENTANYL CITRATE (PF) 100 MCG/2ML IJ SOLN: 25.0000 ug | INTRAMUSCULAR | Status: DC | PRN

## 2017-03-16 MED ORDER — ONDANSETRON HCL 4 MG/2ML IJ SOLN
INTRAMUSCULAR | Status: AC
  Filled 2017-03-16: qty 2

## 2017-03-16 MED ORDER — IBUPROFEN 800 MG PO TABS: 800.0000 mg | ORAL_TABLET | Freq: Three times a day (TID) | ORAL | 0 refills | Status: AC | PRN

## 2017-03-16 MED ORDER — MIDAZOLAM HCL 2 MG/2ML IJ SOLN
INTRAMUSCULAR | Status: AC
  Filled 2017-03-16: qty 2

## 2017-03-16 MED ORDER — GABAPENTIN 300 MG PO CAPS
ORAL_CAPSULE | ORAL | Status: AC
  Filled 2017-03-16: qty 1

## 2017-03-16 MED ORDER — ACETAMINOPHEN 500 MG PO TABS
1000.0000 mg | ORAL_TABLET | ORAL | Status: AC
  Administered 2017-03-16: 1000 mg via ORAL

## 2017-03-16 MED ORDER — CELECOXIB 200 MG PO CAPS
200.0000 mg | ORAL_CAPSULE | ORAL | Status: AC
  Administered 2017-03-16: 200 mg via ORAL

## 2017-03-16 MED ORDER — GABAPENTIN 300 MG PO CAPS
300.0000 mg | ORAL_CAPSULE | ORAL | Status: AC
  Administered 2017-03-16: 300 mg via ORAL

## 2017-03-16 MED ORDER — DEXAMETHASONE SODIUM PHOSPHATE 10 MG/ML IJ SOLN
INTRAMUSCULAR | Status: AC
  Filled 2017-03-16: qty 1

## 2017-03-16 MED ORDER — PROPOFOL 10 MG/ML IV BOLUS
INTRAVENOUS | Status: DC | PRN
  Administered 2017-03-16: 200 mg via INTRAVENOUS

## 2017-03-16 SURGICAL SUPPLY — 47 items
ADH SKN CLS APL DERMABOND .7 (GAUZE/BANDAGES/DRESSINGS) ×1
APPLIER CLIP 9.375 MED OPEN (MISCELLANEOUS)
APR CLP MED 9.3 20 MLT OPN (MISCELLANEOUS)
BINDER BREAST LRG (GAUZE/BANDAGES/DRESSINGS) ×2 IMPLANT
BINDER BREAST XLRG (GAUZE/BANDAGES/DRESSINGS) IMPLANT
BLADE SURG 15 STRL LF DISP TIS (BLADE) ×1 IMPLANT
BLADE SURG 15 STRL SS (BLADE) ×2
CANISTER SUC SOCK COL 7IN (MISCELLANEOUS) IMPLANT
CANISTER SUCT 1200ML W/VALVE (MISCELLANEOUS) IMPLANT
CHLORAPREP W/TINT 26ML (MISCELLANEOUS) ×2 IMPLANT
CLIP APPLIE 9.375 MED OPEN (MISCELLANEOUS) IMPLANT
COVER BACK TABLE 60X90IN (DRAPES) ×2 IMPLANT
COVER MAYO STAND STRL (DRAPES) ×2 IMPLANT
COVER PROBE W GEL 5X96 (DRAPES) ×2 IMPLANT
DERMABOND ADVANCED (GAUZE/BANDAGES/DRESSINGS) ×1
DERMABOND ADVANCED .7 DNX12 (GAUZE/BANDAGES/DRESSINGS) ×1 IMPLANT
DEVICE DUBIN W/COMP PLATE 8390 (MISCELLANEOUS) ×2 IMPLANT
DRAPE LAPAROTOMY 100X72 PEDS (DRAPES) ×2 IMPLANT
DRAPE UTILITY XL STRL (DRAPES) ×2 IMPLANT
ELECT COATED BLADE 2.86 ST (ELECTRODE) ×2 IMPLANT
ELECT REM PT RETURN 9FT ADLT (ELECTROSURGICAL) ×2
ELECTRODE REM PT RTRN 9FT ADLT (ELECTROSURGICAL) ×1 IMPLANT
GLOVE BIOGEL PI IND STRL 7.0 (GLOVE) IMPLANT
GLOVE BIOGEL PI IND STRL 8 (GLOVE) ×1 IMPLANT
GLOVE BIOGEL PI INDICATOR 7.0 (GLOVE) ×1
GLOVE BIOGEL PI INDICATOR 8 (GLOVE) ×1
GLOVE ECLIPSE 8.0 STRL XLNG CF (GLOVE) ×2 IMPLANT
GLOVE SURG SS PI 6.5 STRL IVOR (GLOVE) ×1 IMPLANT
GOWN STRL REUS W/ TWL LRG LVL3 (GOWN DISPOSABLE) ×2 IMPLANT
GOWN STRL REUS W/TWL LRG LVL3 (GOWN DISPOSABLE) ×4
HEMOSTAT ARISTA ABSORB 3G PWDR (MISCELLANEOUS) IMPLANT
HEMOSTAT SNOW SURGICEL 2X4 (HEMOSTASIS) IMPLANT
KIT MARKER MARGIN INK (KITS) ×2 IMPLANT
NEEDLE HYPO 25X1 1.5 SAFETY (NEEDLE) ×2 IMPLANT
NS IRRIG 1000ML POUR BTL (IV SOLUTION) ×2 IMPLANT
PACK BASIN DAY SURGERY FS (CUSTOM PROCEDURE TRAY) ×2 IMPLANT
PENCIL BUTTON HOLSTER BLD 10FT (ELECTRODE) ×2 IMPLANT
SLEEVE SCD COMPRESS KNEE MED (MISCELLANEOUS) ×2 IMPLANT
SPONGE LAP 4X18 X RAY DECT (DISPOSABLE) ×2 IMPLANT
SUT MNCRL AB 4-0 PS2 18 (SUTURE) ×2 IMPLANT
SUT SILK 2 0 SH (SUTURE) IMPLANT
SUT VICRYL 3-0 CR8 SH (SUTURE) ×2 IMPLANT
SYR CONTROL 10ML LL (SYRINGE) ×2 IMPLANT
TOWEL OR 17X24 6PK STRL BLUE (TOWEL DISPOSABLE) ×2 IMPLANT
TOWEL OR NON WOVEN STRL DISP B (DISPOSABLE) ×2 IMPLANT
TUBE CONNECTING 20X1/4 (TUBING) IMPLANT
YANKAUER SUCT BULB TIP NO VENT (SUCTIONS) IMPLANT

## 2017-03-16 NOTE — Transfer of Care (Signed)
Immediate Anesthesia Transfer of Care Note  Patient: Kelly Nelson  Procedure(s) Performed: RIGHT BREAST LUMPECTOMY WITH RADIOACTIVE SEED LOCALIZATION ERAS PATHWAY (Right Breast)  Patient Location: PACU  Anesthesia Type:General  Level of Consciousness: awake, alert  and oriented  Airway & Oxygen Therapy: Patient Spontanous Breathing and Patient connected to face mask oxygen  Post-op Assessment: Report given to RN and Post -op Vital signs reviewed and stable  Post vital signs: Reviewed and stable  Last Vitals:  Vitals:   03/16/17 0713  BP: (!) 144/98  Pulse: 82  Resp: 18  Temp: 36.5 C  SpO2: 100%    Last Pain:  Vitals:   03/16/17 0713  TempSrc: Oral         Complications: No apparent anesthesia complications

## 2017-03-16 NOTE — Anesthesia Procedure Notes (Signed)
Procedure Name: LMA Insertion Date/Time: 03/16/2017 7:36 AM Performed by: Rayvon Char, CRNA Pre-anesthesia Checklist: Patient identified and Emergency Drugs available Patient Re-evaluated:Patient Re-evaluated prior to induction Oxygen Delivery Method: Circle system utilized Preoxygenation: Pre-oxygenation with 100% oxygen Induction Type: IV induction Ventilation: Mask ventilation without difficulty LMA: LMA inserted LMA Size: 4.0 Number of attempts: 1 Dental Injury: Teeth and Oropharynx as per pre-operative assessment

## 2017-03-16 NOTE — Op Note (Signed)
Preoperative diagnosis: right breast papilloma  Postoperative diagnosis: Same   Procedure: right  breast seed localized lumpectomy  Surgeon: Erroll Luna M.D.  Anesthesia: Gen. With 0.25% Sensorcaine local  EBL: 20 cc  Specimen: right  breast tissue with clip. Radioactive seed sent separately and inferior margin sent separately. Verified with neoprobe and radiographic image showing both seed and clip.   Indications for procedure: The patient presents for right  lumpectomy after core biopsy showed papilloma. Discussed the rationale for considering excision. Small risk of malignancy associated with papilloma lesion after core biopsy. Discussed observation. Discussed seed localization. Patient desired excision of right  breast papilloma.The procedure has been discussed with the patient. Alternatives to surgery have been discussed with the patient.  Risks of surgery include bleeding,  Infection,  Seroma formation, death,  and the need for further surgery.   The patient understands and wishes to proceed.   Description of procedure: Patient underwent seed placement as an outpatient. Patient presents today for right breast seed localized lumpectomy. Patient seen and marked in the  holding area. Questions  answered . Patient taken back to the operating room and placed upon the OR table. After induction of general anesthesia, right  breast prepped and draped in a sterile fashion. Timeout was done to verify proper  procedure. Neoprobe used and hot spot identified and right  Breast central region posterior to the NAC. This was marked with pen. Curvilinear incision made along the superior aspect of the NAC. Dissection used with the help of a neoprobe around the tissue where the seed and clip were located. The seed and clip were separated but but were identified and sent to pathology.  The clip was present in the tissue and the inferior margin was removed which corresponded with the clip side of the specimen.  This was communicated to radiology..Hemostasis  achieved and cavity closed with 3-0 Vicryl and 4-0 Monocryl. Dermabond applied. All final counts found to be correct. Specimens transported to pathology. Patient awoke extubated taken to recovery in satisfactory condition.

## 2017-03-16 NOTE — Interval H&P Note (Signed)
History and Physical Interval Note:  03/16/2017 7:19 AM  Kelly Nelson  has presented today for surgery, with the diagnosis of papilloma  The various methods of treatment have been discussed with the patient and family. After consideration of risks, benefits and other options for treatment, the patient has consented to  Procedure(s): RIGHT BREAST LUMPECTOMY WITH RADIOACTIVE SEED LOCALIZATION ERAS PATHWAY (Right) as a surgical intervention .  The patient's history has been reviewed, patient examined, no change in status, stable for surgery.  I have reviewed the patient's chart and labs.  Questions were answered to the patient's satisfaction.     [place]

## 2017-03-16 NOTE — Discharge Instructions (Signed)
°Post Anesthesia Home Care Instructions ° °Activity: °Get plenty of rest for the remainder of the day. A responsible individual must stay with you for 24 hours following the procedure.  °For the next 24 hours, DO NOT: °-Drive a car °-Operate machinery °-Drink alcoholic beverages °-Take any medication unless instructed by your physician °-Make any legal decisions or sign important papers. ° °Meals: °Start with liquid foods such as gelatin or soup. Progress to regular foods as tolerated. Avoid greasy, spicy, heavy foods. If nausea and/or vomiting occur, drink only clear liquids until the nausea and/or vomiting subsides. Call your physician if vomiting continues. ° °Special Instructions/Symptoms: °Your throat may feel dry or sore from the anesthesia or the breathing tube placed in your throat during surgery. If this causes discomfort, gargle with warm salt water. The discomfort should disappear within 24 hours. ° °If you had a scopolamine patch placed behind your ear for the management of post- operative nausea and/or vomiting: ° °1. The medication in the patch is effective for 72 hours, after which it should be removed.  Wrap patch in a tissue and discard in the trash. Wash hands thoroughly with soap and water. °2. You may remove the patch earlier than 72 hours if you experience unpleasant side effects which may include dry mouth, dizziness or visual disturbances. °3. Avoid touching the patch. Wash your hands with soap and water after contact with the patch. °  ° ° ° ° °[facility] °Office Phone Number [phone] ° °BREAST BIOPSY/ PARTIAL MASTECTOMY: POST OP INSTRUCTIONS ° °Always review your discharge instruction sheet given to you by the facility where your surgery was performed. ° °IF YOU HAVE DISABILITY OR FAMILY LEAVE FORMS, YOU MUST BRING THEM TO THE OFFICE FOR PROCESSING.  DO NOT GIVE THEM TO YOUR DOCTOR. ° °1. A prescription for pain medication may be given to you upon discharge.  Take your  pain medication as prescribed, if needed.  If narcotic pain medicine is not needed, then you may take acetaminophen (Tylenol) or ibuprofen (Advil) as needed. °2. Take your usually prescribed medications unless otherwise directed °3. If you need a refill on your pain medication, please contact your pharmacy.  They will contact our office to request authorization.  Prescriptions will not be filled after 5pm or on week-ends. °4. You should eat very light the first 24 hours after surgery, such as soup, crackers, pudding, etc.  Resume your normal diet the day after surgery. °5. Most patients will experience some swelling and bruising in the breast.  Ice packs and a good support bra will help.  Swelling and bruising can take several days to resolve.  °6. It is common to experience some constipation if taking pain medication after surgery.  Increasing fluid intake and taking a stool softener will usually help or prevent this problem from occurring.  A mild laxative (Milk of Magnesia or Miralax) should be taken according to package directions if there are no bowel movements after 48 hours. °7. Unless discharge instructions indicate otherwise, you may remove your bandages 24-48 hours after surgery, and you may shower at that time.  You may have steri-strips (small skin tapes) in place directly over the incision.  These strips should be left on the skin for 7-10 days.  If your surgeon used skin glue on the incision, you may shower in 24 hours.  The glue will flake off over the next 2-3 weeks.  Any sutures or staples will be removed at the office during your follow-up visit. °  8. ACTIVITIES:  You may resume regular daily activities (gradually increasing) beginning the next day.  Wearing a good support bra or sports bra minimizes pain and swelling.  You may have sexual intercourse when it is comfortable. °a. You may drive when you no longer are taking prescription pain medication, you can comfortably wear a seatbelt, and you can  safely maneuver your car and apply brakes. °b. RETURN TO WORK:  ______________________________________________________________________________________ °9. You should see your doctor in the office for a follow-up appointment approximately two weeks after your surgery.  Your doctor’s nurse will typically make your follow-up appointment when she calls you with your pathology report.  Expect your pathology report 2-3 business days after your surgery.  You may call to check if you do not hear from us after three days. °10. OTHER INSTRUCTIONS: _______________________________________________________________________________________________ _____________________________________________________________________________________________________________________________________ °_____________________________________________________________________________________________________________________________________ °_____________________________________________________________________________________________________________________________________ ° °WHEN TO CALL YOUR DOCTOR: °1. Fever over 101.0 °2. Nausea and/or vomiting. °3. Extreme swelling or bruising. °4. Continued bleeding from incision. °5. Increased pain, redness, or drainage from the incision. ° °The clinic staff is available to answer your questions during regular business hours.  Please don’t hesitate to call and ask to speak to one of the nurses for clinical concerns.  If you have a medical emergency, go to the nearest emergency room or call 911.  A surgeon from [facility] is always on call at the hospital. ° °For further questions, please visit [email or web address]  °

## 2017-03-16 NOTE — Anesthesia Postprocedure Evaluation (Signed)
Anesthesia Post Note  Patient: Kelly Nelson  Procedure(s) Performed: RIGHT BREAST LUMPECTOMY WITH RADIOACTIVE SEED LOCALIZATION ERAS PATHWAY (Right Breast)     Patient location during evaluation: PACU Anesthesia Type: General Level of consciousness: awake and alert Pain management: pain level controlled Vital Signs Assessment: post-procedure vital signs reviewed and stable Respiratory status: spontaneous breathing, nonlabored ventilation, respiratory function stable and patient connected to nasal cannula oxygen Cardiovascular status: blood pressure returned to baseline and stable Postop Assessment: no apparent nausea or vomiting Anesthetic complications: no    Last Vitals:  Vitals:   03/16/17 0900 03/16/17 0913  BP: (!) 151/93 (!) 156/89  Pulse: 81 81  Resp: 13 18  Temp:  36.5 C  SpO2: 100% (!) 85%    Last Pain:  Vitals:   03/16/17 0913  TempSrc: Oral  PainSc: 0-No pain                 Havard Radigan EDWARD

## 2017-03-18 ENCOUNTER — Encounter (HOSPITAL_BASED_OUTPATIENT_CLINIC_OR_DEPARTMENT_OTHER): Payer: Self-pay | Admitting: Surgery

## 2017-04-09 DIAGNOSIS — F411 Generalized anxiety disorder: Secondary | ICD-10-CM | POA: Diagnosis not present

## 2017-04-09 DIAGNOSIS — F3181 Bipolar II disorder: Secondary | ICD-10-CM | POA: Diagnosis not present

## 2017-08-02 DIAGNOSIS — M5030 Other cervical disc degeneration, unspecified cervical region: Secondary | ICD-10-CM | POA: Diagnosis not present

## 2017-08-02 DIAGNOSIS — M545 Low back pain: Secondary | ICD-10-CM | POA: Diagnosis not present

## 2017-08-02 DIAGNOSIS — G894 Chronic pain syndrome: Secondary | ICD-10-CM | POA: Diagnosis not present

## 2017-08-02 DIAGNOSIS — Z79891 Long term (current) use of opiate analgesic: Secondary | ICD-10-CM | POA: Diagnosis not present

## 2017-08-31 DIAGNOSIS — G894 Chronic pain syndrome: Secondary | ICD-10-CM | POA: Diagnosis not present

## 2017-08-31 DIAGNOSIS — Z79891 Long term (current) use of opiate analgesic: Secondary | ICD-10-CM | POA: Diagnosis not present

## 2017-09-30 DIAGNOSIS — M5136 Other intervertebral disc degeneration, lumbar region: Secondary | ICD-10-CM | POA: Diagnosis not present

## 2017-09-30 DIAGNOSIS — Z79891 Long term (current) use of opiate analgesic: Secondary | ICD-10-CM | POA: Diagnosis not present

## 2017-09-30 DIAGNOSIS — M545 Low back pain: Secondary | ICD-10-CM | POA: Diagnosis not present

## 2017-09-30 DIAGNOSIS — G894 Chronic pain syndrome: Secondary | ICD-10-CM | POA: Diagnosis not present

## 2017-10-06 DIAGNOSIS — F411 Generalized anxiety disorder: Secondary | ICD-10-CM | POA: Diagnosis not present

## 2017-10-06 DIAGNOSIS — F3181 Bipolar II disorder: Secondary | ICD-10-CM | POA: Diagnosis not present

## 2017-10-18 DIAGNOSIS — N959 Unspecified menopausal and perimenopausal disorder: Secondary | ICD-10-CM | POA: Diagnosis not present

## 2017-10-18 DIAGNOSIS — Z23 Encounter for immunization: Secondary | ICD-10-CM | POA: Diagnosis not present

## 2017-10-29 DIAGNOSIS — M545 Low back pain: Secondary | ICD-10-CM | POA: Diagnosis not present

## 2017-10-29 DIAGNOSIS — M5136 Other intervertebral disc degeneration, lumbar region: Secondary | ICD-10-CM | POA: Diagnosis not present

## 2017-10-29 DIAGNOSIS — Z79891 Long term (current) use of opiate analgesic: Secondary | ICD-10-CM | POA: Diagnosis not present

## 2017-10-29 DIAGNOSIS — G894 Chronic pain syndrome: Secondary | ICD-10-CM | POA: Diagnosis not present

## 2017-12-25 DIAGNOSIS — G894 Chronic pain syndrome: Secondary | ICD-10-CM | POA: Diagnosis not present

## 2017-12-25 DIAGNOSIS — M5136 Other intervertebral disc degeneration, lumbar region: Secondary | ICD-10-CM | POA: Diagnosis not present

## 2017-12-25 DIAGNOSIS — M5030 Other cervical disc degeneration, unspecified cervical region: Secondary | ICD-10-CM | POA: Diagnosis not present

## 2017-12-25 DIAGNOSIS — Z79891 Long term (current) use of opiate analgesic: Secondary | ICD-10-CM | POA: Diagnosis not present

## 2018-04-05 DIAGNOSIS — F3181 Bipolar II disorder: Secondary | ICD-10-CM | POA: Diagnosis not present

## 2018-05-10 DIAGNOSIS — S61259A Open bite of unspecified finger without damage to nail, initial encounter: Secondary | ICD-10-CM | POA: Diagnosis not present

## 2018-05-10 DIAGNOSIS — W5501XA Bitten by cat, initial encounter: Secondary | ICD-10-CM | POA: Diagnosis not present

## 2018-05-24 DIAGNOSIS — S61259D Open bite of unspecified finger without damage to nail, subsequent encounter: Secondary | ICD-10-CM | POA: Diagnosis not present

## 2018-05-24 DIAGNOSIS — W5501XD Bitten by cat, subsequent encounter: Secondary | ICD-10-CM | POA: Diagnosis not present

## 2018-09-28 DIAGNOSIS — F411 Generalized anxiety disorder: Secondary | ICD-10-CM | POA: Diagnosis not present

## 2018-09-28 DIAGNOSIS — F3181 Bipolar II disorder: Secondary | ICD-10-CM | POA: Diagnosis not present

## 2018-11-16 DIAGNOSIS — F411 Generalized anxiety disorder: Secondary | ICD-10-CM | POA: Diagnosis not present

## 2018-11-16 DIAGNOSIS — F3181 Bipolar II disorder: Secondary | ICD-10-CM | POA: Diagnosis not present

## 2019-04-10 IMAGING — US ULTRASOUND RIGHT BREAST LIMITED
1 series · 13 of 16 positions shown · non-contrast
Comparison: Previous exam(s).

CLINICAL DATA: Followup for probably benign right breast masses.

EXAM:
ULTRASOUND OF THE RIGHT BREAST

[Series 1: ultrasound right breast limited · 0.06mm/px · 13 of 16 slices shown]
[im 1/16]
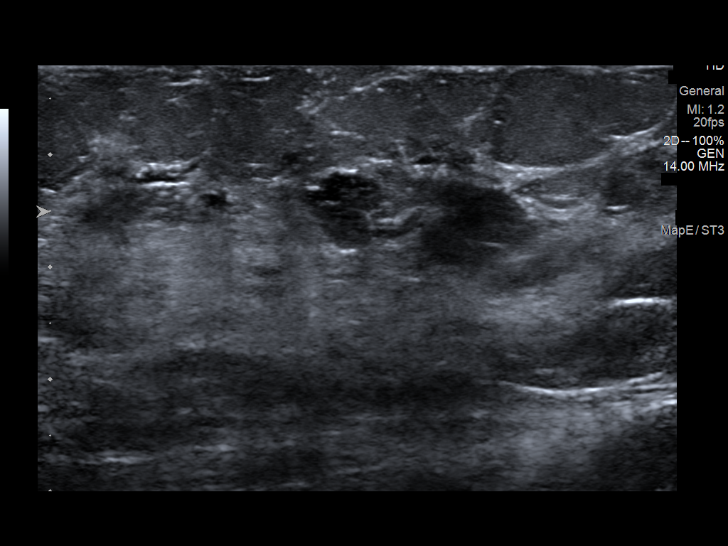
[im 2/16]
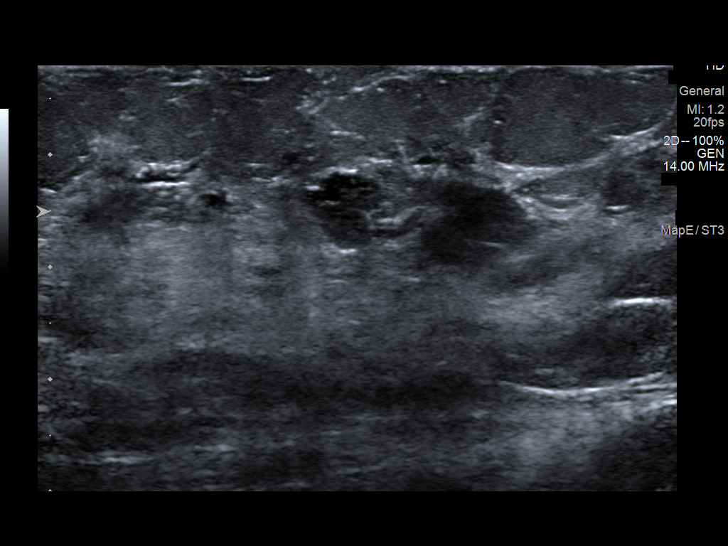
[im 4/16]
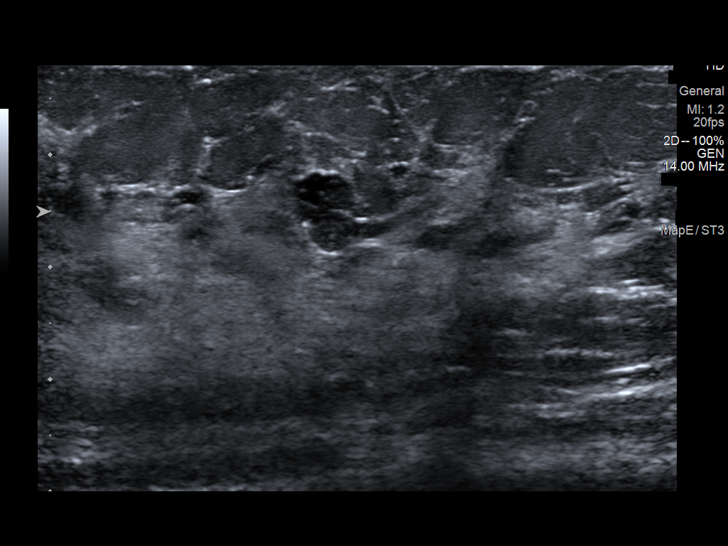
[im 5/16]
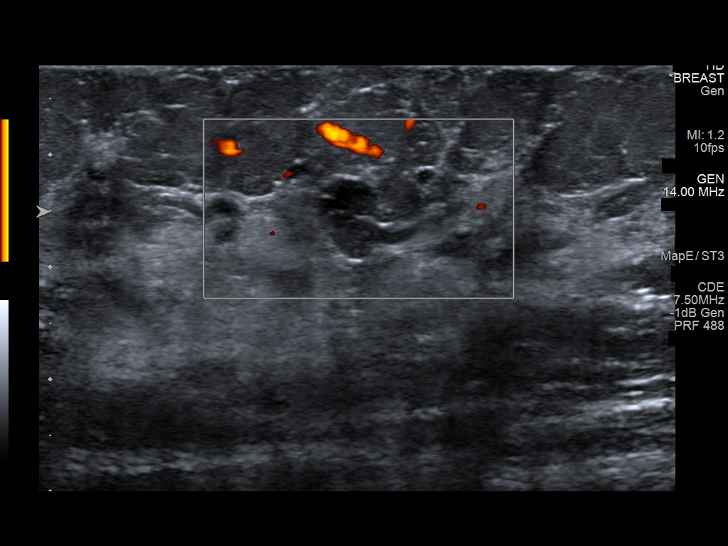
[im 6/16]
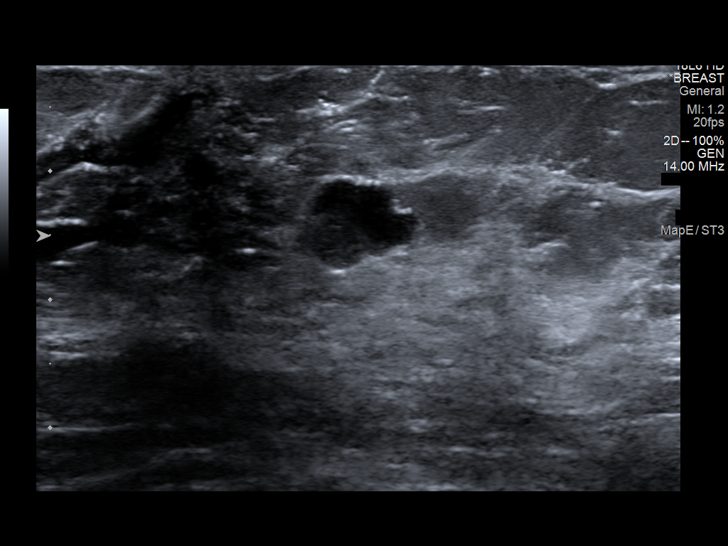
[im 7/16]
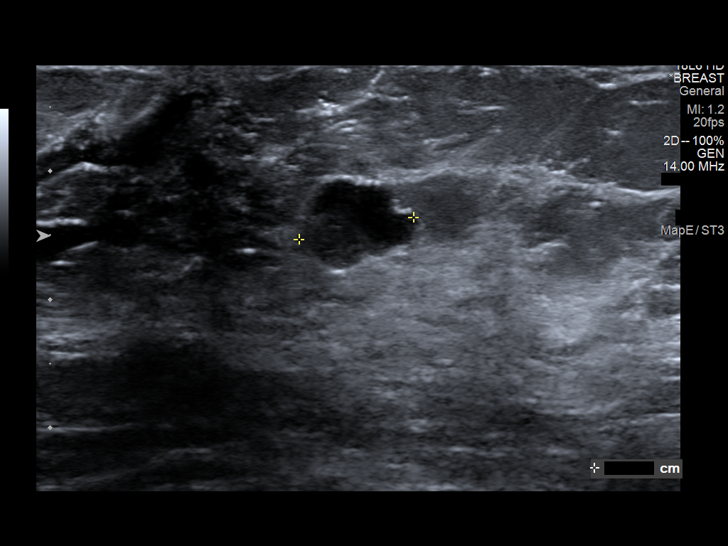
[im 9/16]
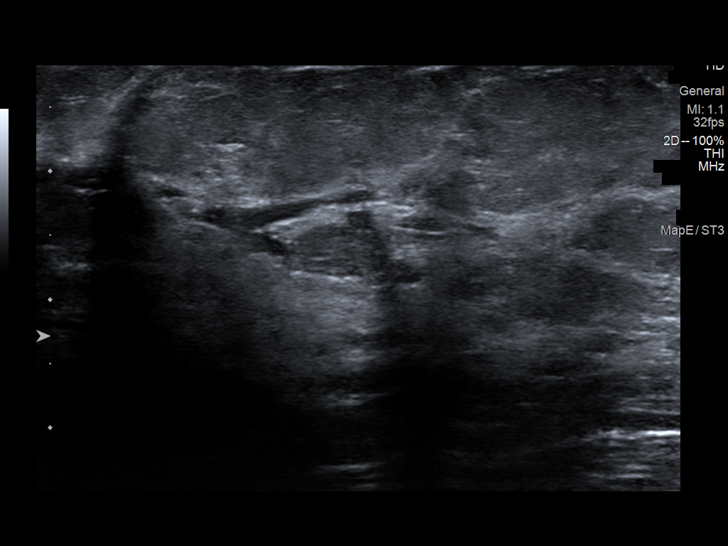
[im 10/16]
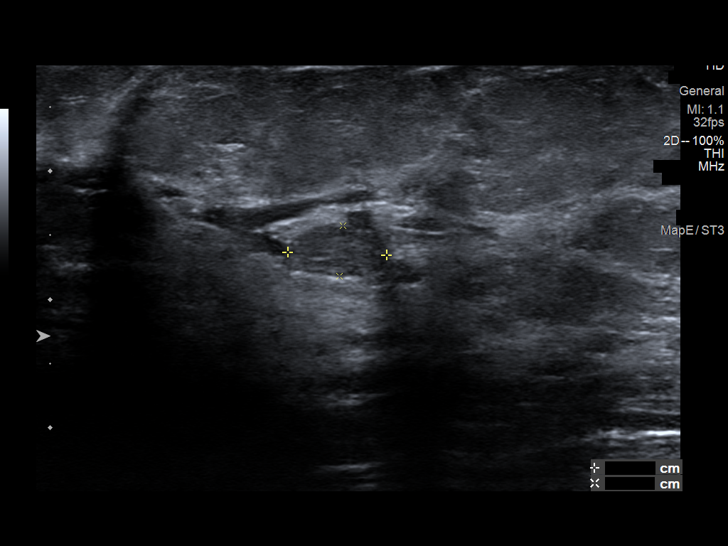
[im 11/16]
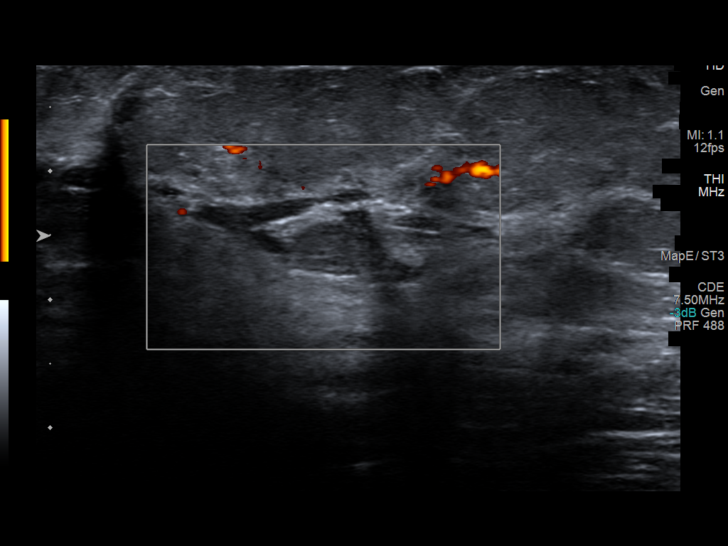
[im 12/16]
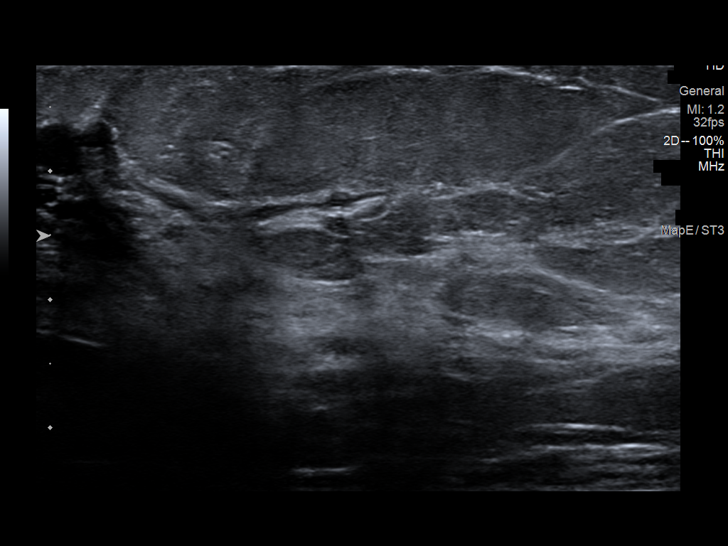
[im 13/16]
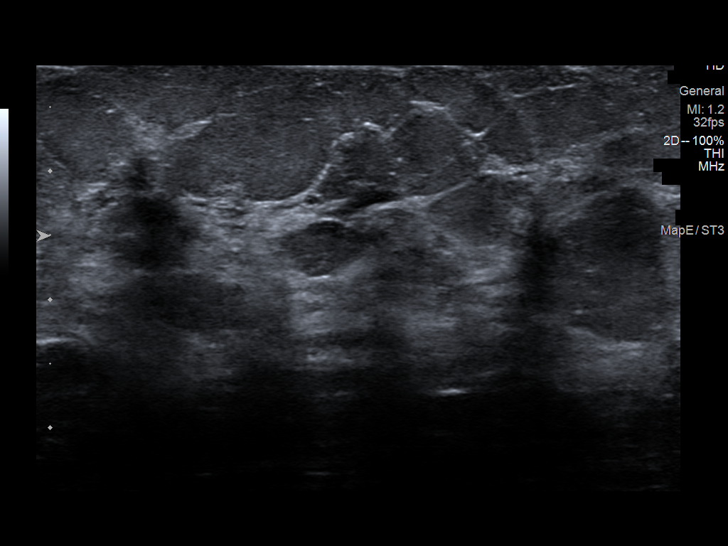
[im 15/16]
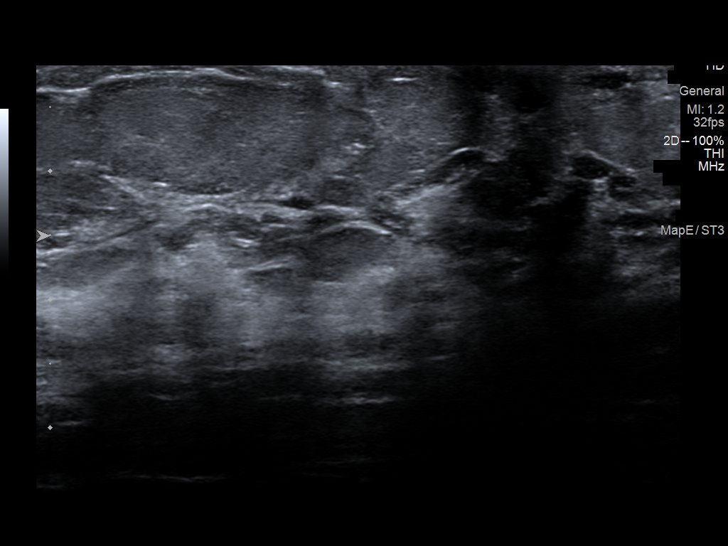
[im 16/16]
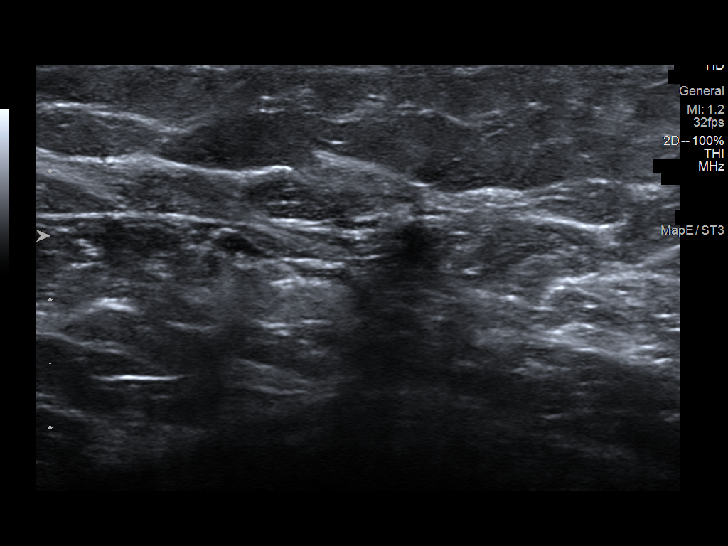

[13 of 16 positions shown; findings below may reference images not displayed]

FINDINGS: Targeted ultrasound of the right breast was performed demonstrating
an oval well-circumscribed near isoechoic mass at 1 o'clock 1 cm
from the nipple measuring 0.6 x 0.8 x 0.4 cm, unchanged in size and
appearance and possibly representing a fat lobule or fibroadenoma.
There is a mixed cystic and solid appearing mass in the right breast
at 1 o'clock retroareolar measuring 0.9 x 0.7 x 0.7 cm. This may
represent a complicated cyst with adherent/immobile debris, however
an intracystic mass cannot be ruled out. No lymphadenopathy seen in
the right axilla.
IMPRESSION: 1. Indeterminate mixed cystic and solid appearing mass in the right
breast at 1 o'clock retroareolar.

2. Unchanged appearance of benign-appearing mass in the right breast
at 1 o'clock 1 cm from the nipple, possibly a prominent fat lobule
or fibroadenoma.

RECOMMENDATION:
1. Ultrasound-guided biopsy of the solid component of the mixed
cystic and solid mass in the right breast is recommended.

2. Six-month follow-up right breast ultrasound of the mass in the
right breast at 1 o'clock 1 cm from the nipple is recommended if
biopsy results from the additional mass return as benign. If biopsy
results from the 1 o'clock retroareolar mass return as
abnormal/surgical, then ultrasound-guided biopsy of this additional
mass is warranted as well.

This will be scheduled for the patient.

I have discussed the findings and recommendations with the patient.
Results were also provided in writing at the conclusion of the
visit. If applicable, a reminder letter will be sent to the patient
regarding the next appointment.

BI-RADS CATEGORY  4: Suspicious.

## 2019-04-15 IMAGING — US US BREAST BX W LOC DEV 1ST LESION IMG BX SPEC US GUIDE*R*
1 series · 7 of 7 positions shown · non-contrast
Comparison: Previous exam(s).

CLINICAL DATA: Ultrasound-guided biopsy was recommended of a 1
o'clock retroareolar right breast mass.

EXAM:
ULTRASOUND GUIDED RIGHT BREAST CORE NEEDLE BIOPSY

[Series 1: us breast bx w loc dev 1st lesion img bx spec us g · 0.06mm/px · 7 of 7 slices shown]
[im 1/7]
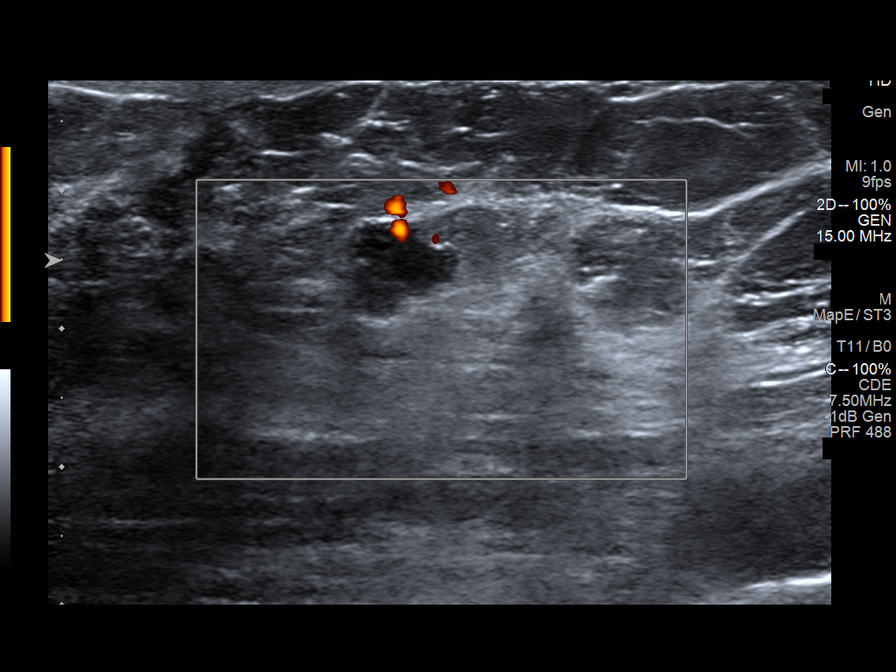
[im 2/7]
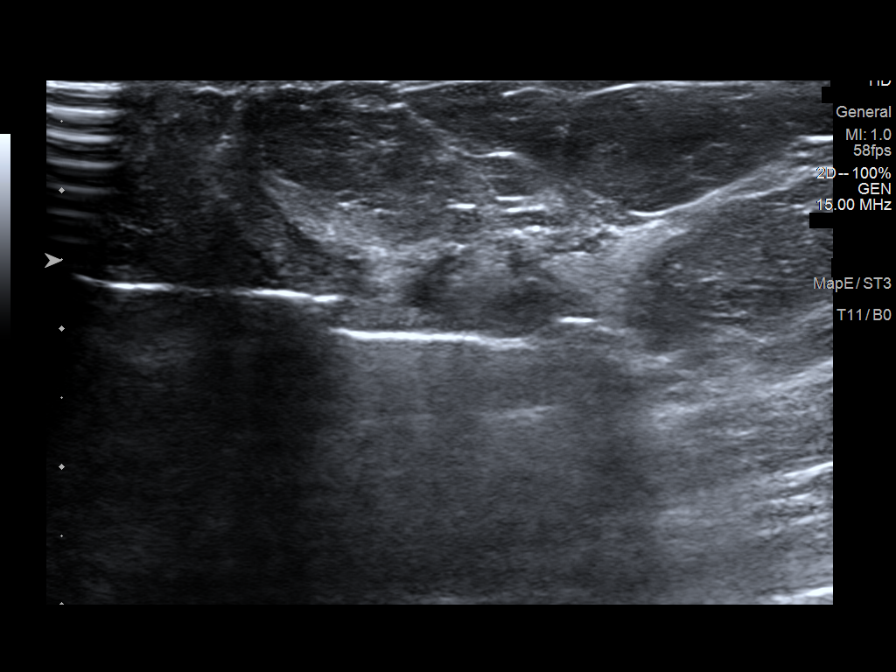
[im 3/7]
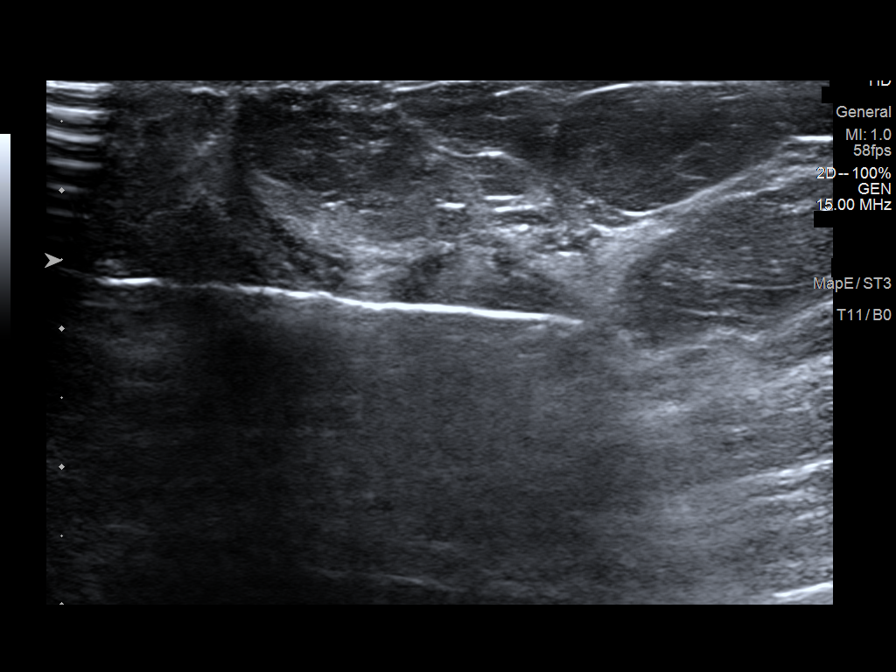
[im 4/7]
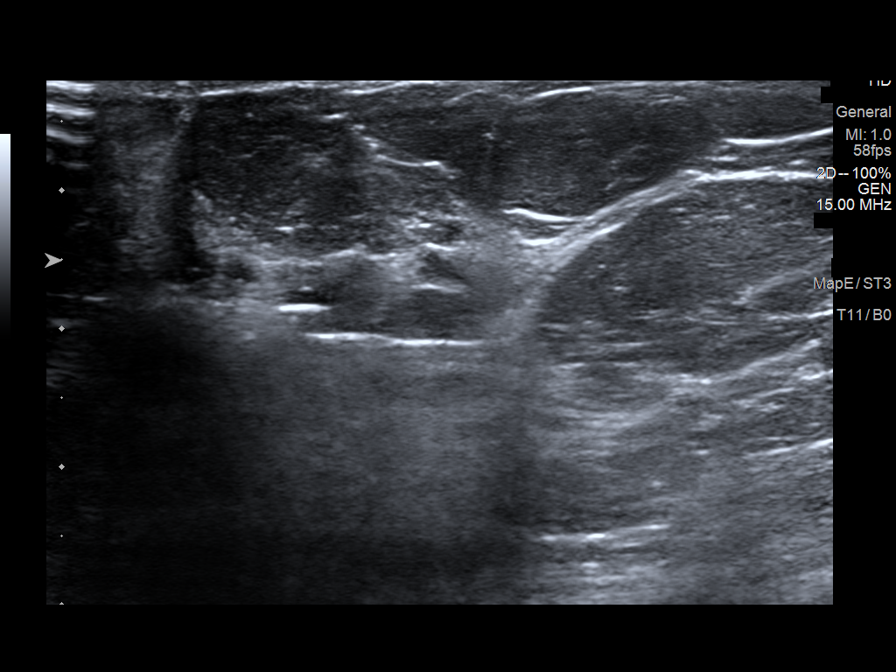
[im 5/7]
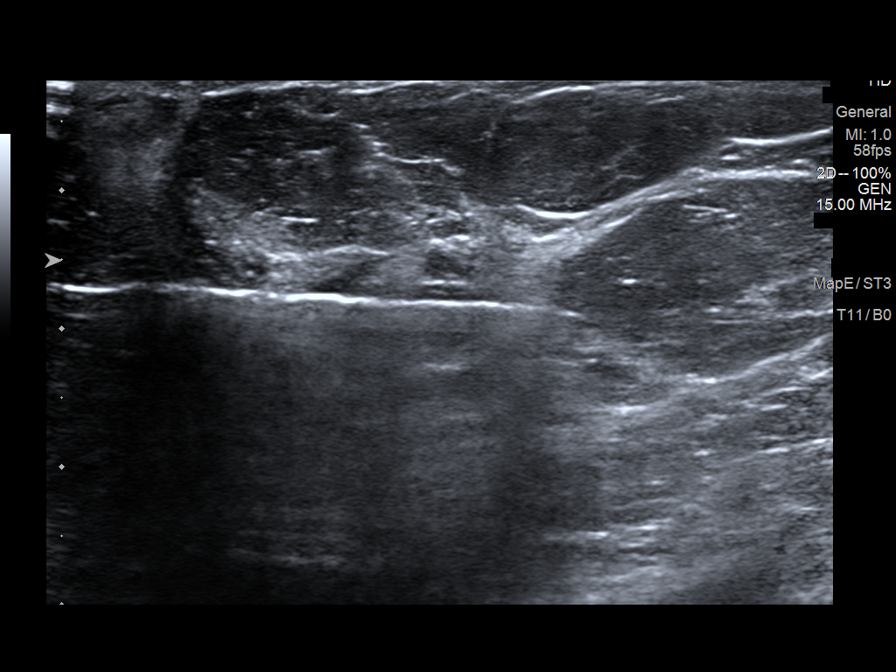
[im 6/7]
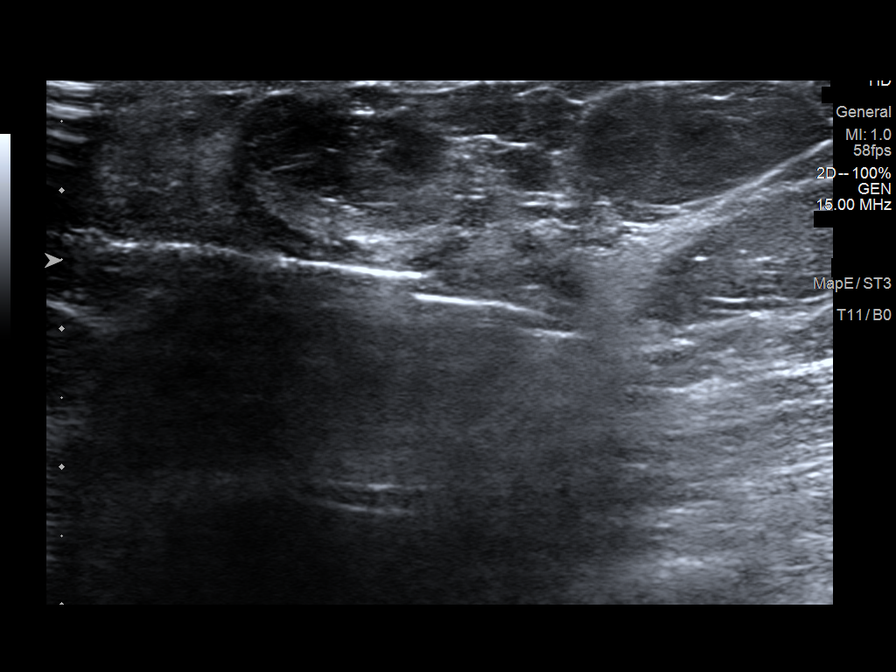
[im 7/7]
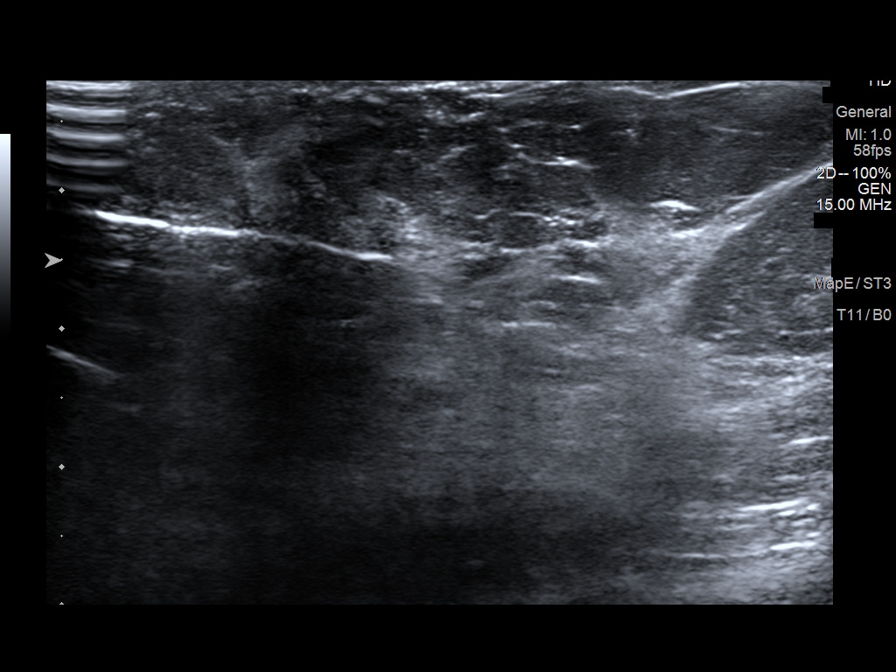

[7 of 7 positions shown; findings below may reference images not displayed]



Lesion quadrant: Upper inner quadrant

Using sterile technique and 1% Lidocaine as local anesthetic, under
direct ultrasound visualization, a 12 gauge Jadda device was
used to perform biopsy of a mass at 1 o'clock position retroareolar
using a lateral to medial approach. At the conclusion of the
procedure a ribbon tissue marker clip was deployed into the biopsy
cavity. Follow up 2 view mammogram was performed and dictated
separately.
IMPRESSION: Ultrasound guided biopsy of the right breast. No apparent
complications.

:
Pathology revealed FEATURES CONSISTENT WITH A PAPILLARY LESION of
the Right breast, 1:00 o'clock retroareolar. This was found to be
concordant by Dr. Ndake Gorsou, with excision recommended. Pathology
results were discussed with the patient by telephone. The patient
reported doing well after the biopsy with tenderness and minimal
bleeding at the site. Post biopsy instructions and care were
reviewed and questions were answered. The patient was encouraged to
call The [REDACTED] for any additional
concerns. Surgical consultation has been arranged with Dr. Dosreis
Bercahaya at [REDACTED] on November 02, 2016. The patient
is scheduled for a Right breast ultrasound guided biopsy on [place]

Pathology results reported by Lea Kristin Baltruma Sunshine, RN on 10/20/2016.

## 2019-04-15 IMAGING — MG MM CLIP PLACEMENT
2 series · 2 of 2 positions shown · non-contrast
Comparison: Previous exam(s).

CLINICAL DATA: Ultrasound-guided biopsy was performed of a a
complex cystic mass in the 1 o'clock retroareolar right breast.

EXAM:
DIAGNOSTIC RIGHT MAMMOGRAM POST ULTRASOUND BIOPSY

[R CC]
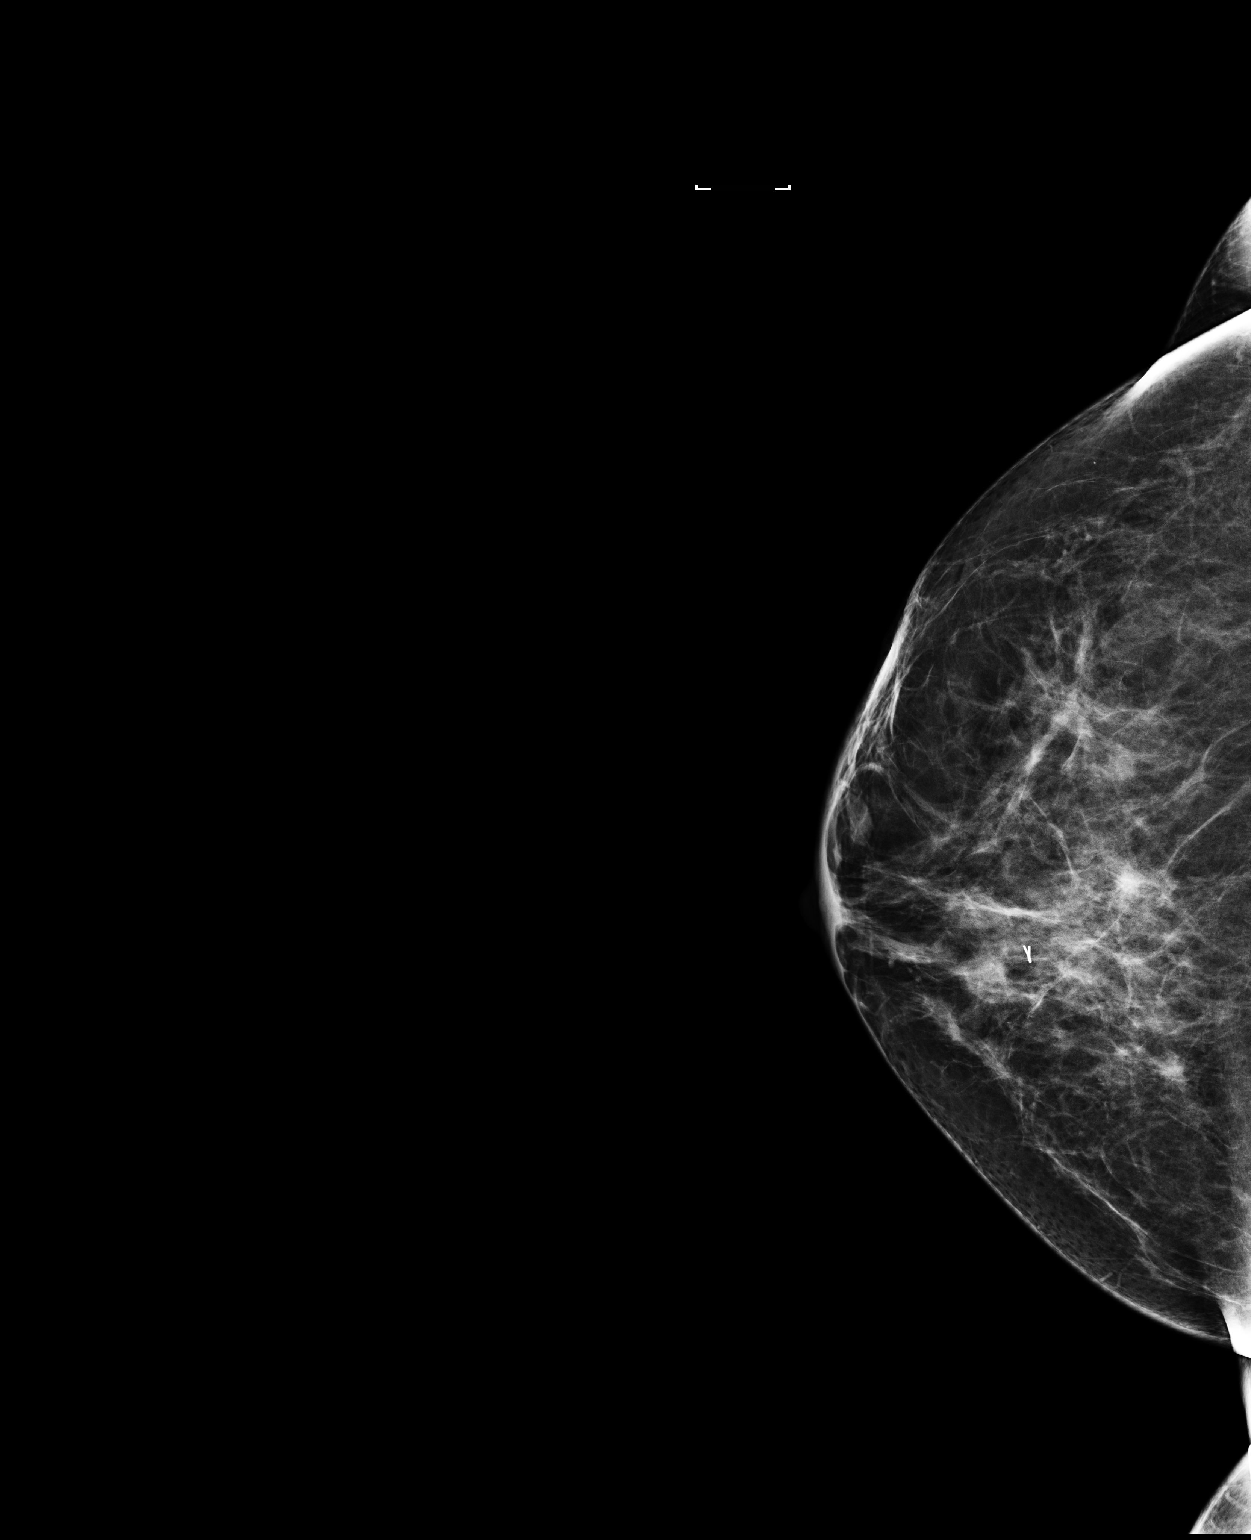

[R ML]
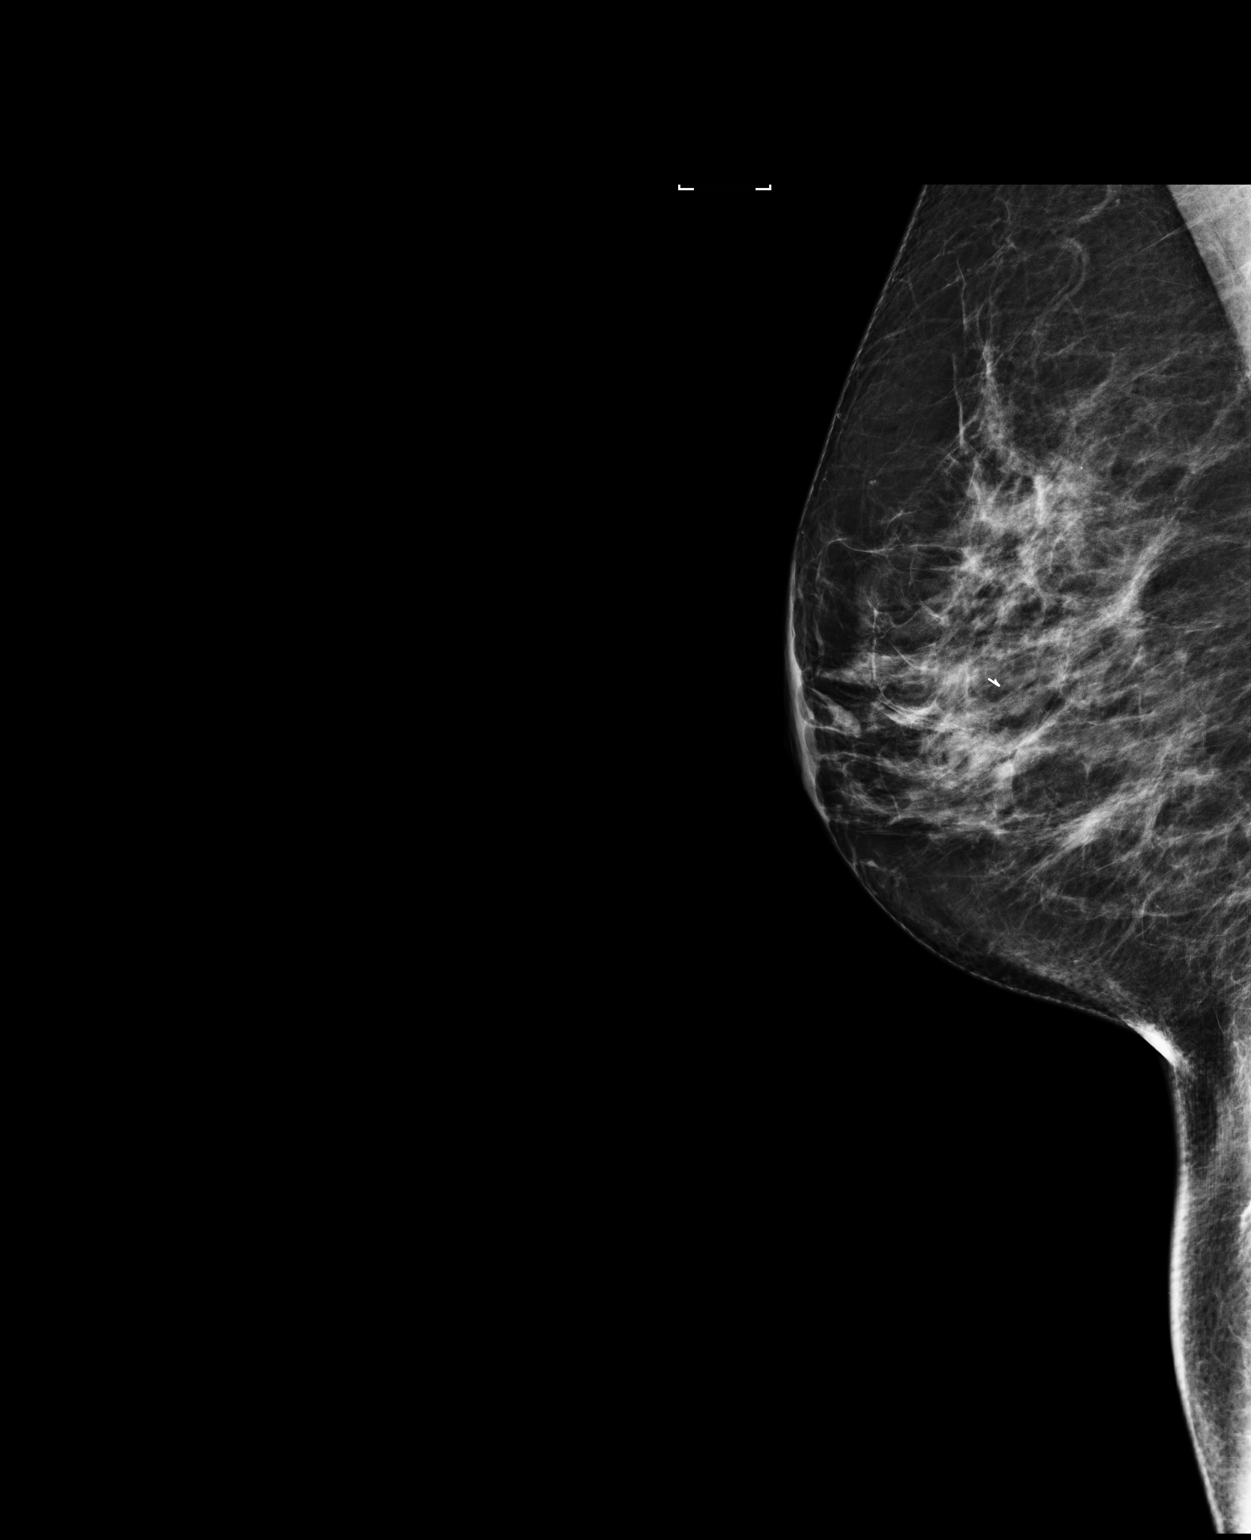

[2 of 2 positions shown; findings below may reference images not displayed]

FINDINGS: Mammographic images were obtained following ultrasound guided biopsy
of a right breast mass 1 o'clock position retroareolar. A ribbon
shaped biopsy clip is satisfactorily positioned in the 1 o'clock
retroareolar right breast.
IMPRESSION: Satisfactory position of ribbon shaped biopsy clip.

Final Assessment: Post Procedure Mammograms for Marker Placement

## 2019-09-10 IMAGING — MG BREAST SURGICAL SPECIMEN
1 series · 1 of 1 positions shown · non-contrast
Comparison: Previous exam(s).

CLINICAL DATA: Specimen radiograph status post right breast
excisional biopsy.

EXAM:
SPECIMEN RADIOGRAPH OF THE RIGHT BREAST

[R]
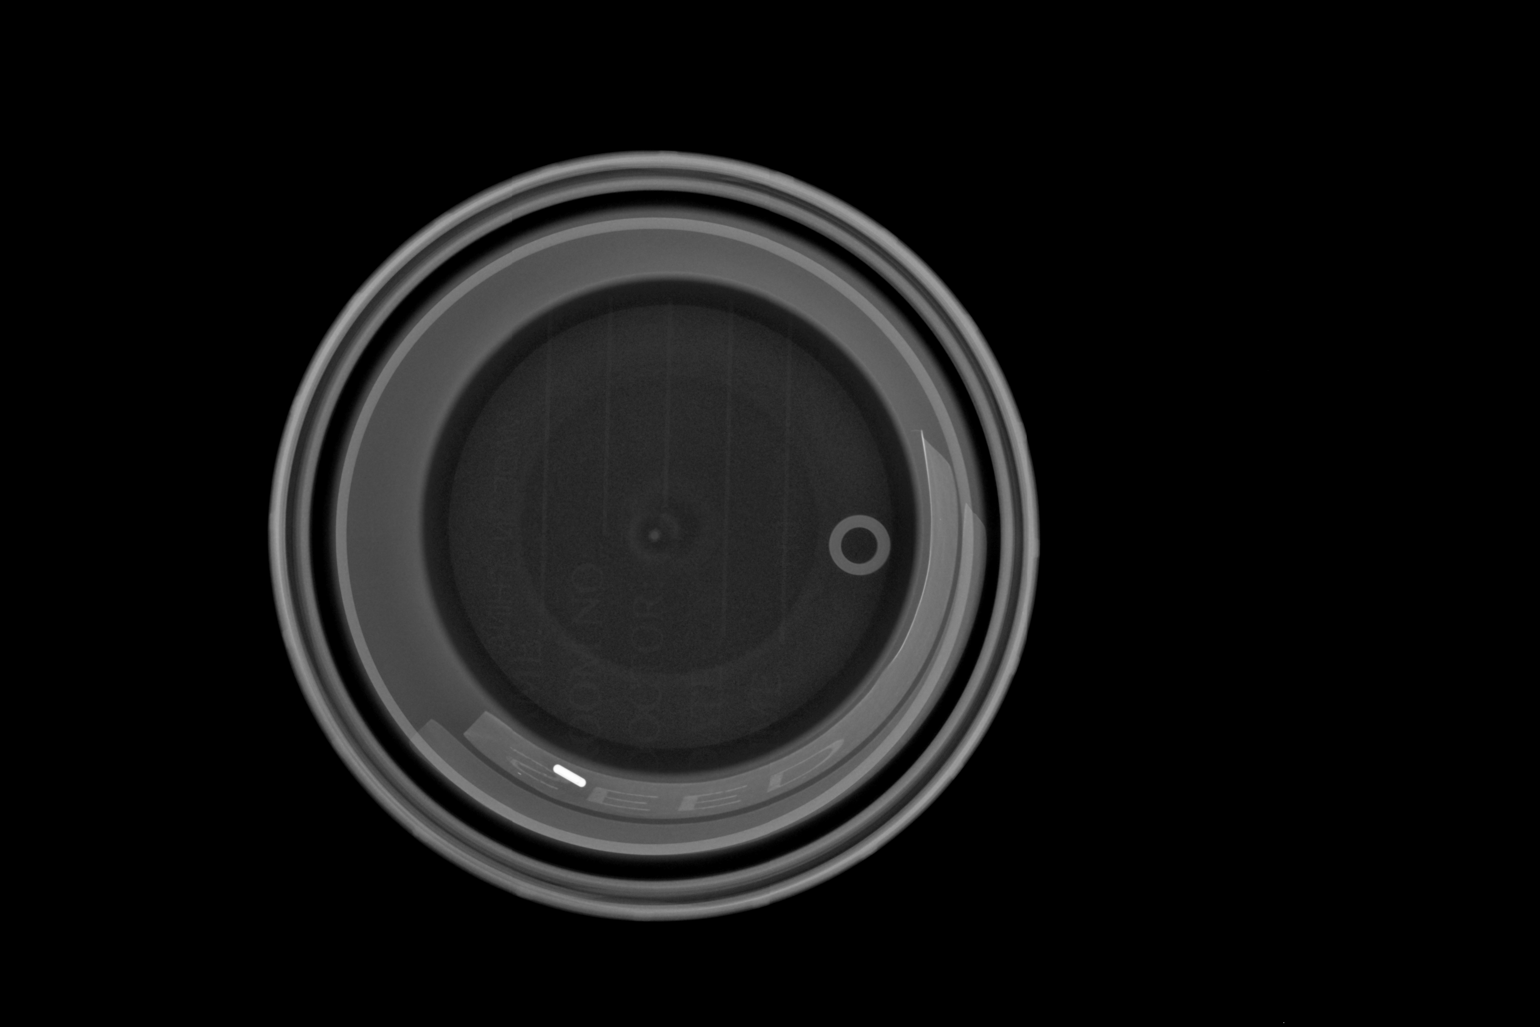

[1 of 1 positions shown; findings below may reference images not displayed]

FINDINGS: Status post excision of the right breast. The radioactive seed and
biopsy marker clip are present present and completely intact. The
biopsy marking clip was marked for pathology, and I did conveyed
that the clip is at the margin of the specimen. The did take
additional tissue following this sample. The radioactive seed is
imaged in a separate container. These findings were communicated
with the OR at [DATE] a.m..
IMPRESSION: Specimen radiograph of the right breast.
